# Patient Record
Sex: Female | Born: 1970 | Race: Black or African American | Hispanic: No | Smoking: Never smoker
Health system: Southern US, Community
[De-identification: ages and names within clinical notes are randomized; demographics above are authoritative.]

## PROBLEM LIST (undated history)

## (undated) DIAGNOSIS — I10 Essential (primary) hypertension: Secondary | ICD-10-CM

## (undated) DIAGNOSIS — C50919 Malignant neoplasm of unspecified site of unspecified female breast: Secondary | ICD-10-CM

## (undated) DIAGNOSIS — M329 Systemic lupus erythematosus, unspecified: Secondary | ICD-10-CM

## (undated) DIAGNOSIS — E785 Hyperlipidemia, unspecified: Secondary | ICD-10-CM

## (undated) HISTORY — DX: Hyperlipidemia, unspecified: E78.5

## (undated) HISTORY — DX: Essential (primary) hypertension: I10

## (undated) HISTORY — DX: Systemic lupus erythematosus, unspecified: M32.9

## (undated) HISTORY — DX: Malignant neoplasm of unspecified site of unspecified female breast: C50.919

---

## 1998-01-27 HISTORY — PX: OTHER SURGICAL HISTORY: SHX169

## 2004-06-27 HISTORY — PX: OTHER SURGICAL HISTORY: SHX169

## 2006-03-29 DIAGNOSIS — I10 Essential (primary) hypertension: Secondary | ICD-10-CM

## 2006-03-29 HISTORY — DX: Essential (primary) hypertension: I10

## 2010-02-16 ENCOUNTER — Ambulatory Visit: Payer: Self-pay | Admitting: Family Medicine

## 2010-02-16 DIAGNOSIS — I10 Essential (primary) hypertension: Secondary | ICD-10-CM

## 2010-02-16 DIAGNOSIS — M329 Systemic lupus erythematosus, unspecified: Secondary | ICD-10-CM | POA: Insufficient documentation

## 2010-02-16 DIAGNOSIS — I889 Nonspecific lymphadenitis, unspecified: Secondary | ICD-10-CM

## 2010-02-17 ENCOUNTER — Encounter: Payer: Self-pay | Admitting: Family Medicine

## 2010-03-09 ENCOUNTER — Encounter (INDEPENDENT_AMBULATORY_CARE_PROVIDER_SITE_OTHER): Payer: Self-pay | Admitting: *Deleted

## 2010-03-17 ENCOUNTER — Encounter: Payer: Self-pay | Admitting: Family Medicine

## 2010-03-17 LAB — CONVERTED CEMR LAB
ALT: 8 units/L (ref 0–35)
AST: 17 units/L (ref 0–37)
Alkaline Phosphatase: 102 units/L (ref 39–117)
Cholesterol: 262 mg/dL — ABNORMAL HIGH (ref 0–200)
Creatinine, Ser: 0.9 mg/dL (ref 0.40–1.20)
LDL Cholesterol: 197 mg/dL — ABNORMAL HIGH (ref 0–99)
Sodium: 139 meq/L (ref 135–145)
Total Bilirubin: 0.4 mg/dL (ref 0.3–1.2)
Total CHOL/HDL Ratio: 6.6
Total Protein: 7.9 g/dL (ref 6.0–8.3)
VLDL: 25 mg/dL (ref 0–40)

## 2010-03-19 ENCOUNTER — Encounter: Payer: Self-pay | Admitting: Family Medicine

## 2010-03-19 LAB — CONVERTED CEMR LAB
ANA Titer 1: 1:640 {titer} — ABNORMAL HIGH
Anti Nuclear Antibody(ANA): POSITIVE — AB
Basophils Relative: 1 % (ref 0–1)
Hemoglobin: 11.7 g/dL — ABNORMAL LOW (ref 12.0–15.0)
Lymphocytes Relative: 38 % (ref 12–46)
MCHC: 32.9 g/dL (ref 30.0–36.0)
Monocytes Relative: 9 % (ref 3–12)
Neutro Abs: 3 10*3/uL (ref 1.7–7.7)
Neutrophils Relative %: 51 % (ref 43–77)
RBC: 4.95 M/uL (ref 3.87–5.11)
TSH: 0.961 microintl units/mL (ref 0.350–4.500)
WBC: 5.9 10*3/uL (ref 4.0–10.5)

## 2010-03-20 LAB — CONVERTED CEMR LAB: Iron: 135 ug/dL (ref 42–145)

## 2010-03-25 ENCOUNTER — Ambulatory Visit: Payer: Self-pay | Admitting: Family Medicine

## 2010-04-13 ENCOUNTER — Ambulatory Visit
Admission: RE | Admit: 2010-04-13 | Discharge: 2010-04-13 | Payer: Self-pay | Source: Home / Self Care | Attending: Family Medicine | Admitting: Family Medicine

## 2010-04-28 NOTE — Assessment & Plan Note (Signed)
Summary: NOV: HTN, LN   Vital Signs:  Patient profile:   40 year old female Height:      62.5 inches Weight:      140 pounds Pulse rate:   98 / minute BP sitting:   199 / 120  (right arm) Cuff size:   regular  Vitals Entered By: Avon Gully CMA, Duncan Dull) (February 16, 2010 1:26 PM)  Serial Vital Signs/Assessments:  Time      Position  BP       Pulse  Resp  Temp     By 1:50 PM             188/105                        Avon Gully CMA, (AAMA)  CC: NP- est care   CC:  NP- est care.  History of Present Illness: Hasn't estab a rheumatologist, Would like to consider seeing Dr. Conception Oms.   HTN - Dx several years ago. Moved her a year ago and hasn't take meds in a long time.   No regular exercise. Not really following a low salt diet. BPS have been running in teh 160s/90s at home.  Also has lumps on the right posterior side of her neck that have been there for several months.   Nontender, no rash or redness.   Habits & Providers  Alcohol-Tobacco-Diet     Alcohol drinks/day: <1     Tobacco Status: never  Exercise-Depression-Behavior     Does Patient Exercise: no     STD Risk: never     Drug Use: never     Seat Belt Use: always  Current Medications (verified): 1)  Omega-3 350 Mg Caps (Omega-3 Fatty Acids)  Allergies (verified): 1)  ! * Sunblock  Comments:  Nurse/Medical Assistant: The patient's medications and allergies were reviewed with the patient and were updated in the Medication and Allergy Lists. 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 Lives alone. Smoking Status:  never Does Patient Exercise:  no STD Risk:  never Drug Use:   never Seat Belt Use:  always  Review of Systems       No fever/sweats/weakness, unexplained weight loss/gain.  No vison changes.  No difficulty hearing/ringing in ears, hay fever/allergies.  No chest pain/discomfort, palpitations.  No Br lump/nipple discharge.  No cough/wheeze.  No blood in BM, nausea/vomiting/diarrhea.  No nighttime urination, leaking urine, unusual vaginal bleeding, discharge (penis or vagina).  No muscle/joint pain. No rash, change in mole.  No HA, memory loss.  No anxiety, sleep d/o, depression.  No easy bruising/bleeding, + unexplained lump   Physical Exam  General:  Well-developed,well-nourished,in no acute distress; alert,appropriate and cooperative throughout examination Head:  Normocephalic and atraumatic without obvious abnormalities. No apparent alopecia or balding. Lungs:  Normal respiratory effort, chest expands symmetrically. Lungs are clear to auscultation, no crackles or wheezes. Heart:  Normal rate and regular rhythm. S1 and S2 normal without gallop, murmur, click, rub or other extra sounds. Skin:  Think hair Scarring on her right lower abdomen.  Cervical Nodes:  She die have 2 palpable LN on the posterior right side of her neck.  They feel pea size and are firm but not hard.    Impression & Recommendations:  Problem # 1:  HYPERTENSION, BENIGN ESSENTIAL (ICD-401.1) Start medication. Discussed potential SE of the meds. F/U in one month.  Discussed importance of tx'ing BP to reduce risk of stroke and MI.  Due for screenign labs as well and need a baseline kidney function.  Her updated medication list for this problem includes:    Losartan Potassium-hctz 100-25 Mg Tabs (Losartan potassium-hctz) .Marland Kitchen... Take 1 tablet by mouth once a day  Orders: T-Comprehensive Metabolic Panel [id number]) T-Lipid Profile ([id number])  Problem # 2:  LYMPHADENITIS (ICD-289.3) Will monitor the LN in her neck Call if getting larger, painful or hot.   Complete Medication  List: 1)  Omega-3 350 Mg Caps (Omega-3 fatty acids) 2)  Losartan Potassium-hctz 100-25 Mg Tabs (Losartan potassium-hctz) .... Take 1 tablet by mouth once a day  Patient Instructions: 1)  Please schedule a follow-up appointment in 1 month for blood pressure. 2)  Work on regular exercise adn low salt diet. Check out the DASH diet (.[email or web address].net/ website) to download it for free.   Contraindications/Deferment of Procedures/Staging:    Test/Procedure: FLU VAX    Reason for deferment: patient declined  Prescriptions: LOSARTAN POTASSIUM-HCTZ 100-25 MG TABS (LOSARTAN POTASSIUM-HCTZ) Take 1 tablet by mouth once a day  #30 x 1   Entered and Authorized by:   Nani Gasser MD   Signed by:   Nani Gasser MD on 02/16/2010   Method used:   Electronically to        [facility] [phone]* (retail)       [address]       [place], [place]  [address]       Ph: [phone] or [phone]       Fax: [phone]   RxID:   [id number]    Orders Added: 1)  T-Comprehensive Metabolic Panel [[id number]] 2)  T-Lipid Profile [[id number]] 3)  New Patient Level III [[address]]

## 2010-04-30 NOTE — Assessment & Plan Note (Signed)
Summary: 1 mo f/u on BP    Vital Signs:  Patient profile:   40 year old female Height:      62.5 inches Weight:      140 pounds Pulse rate:   84 / minute BP sitting:   172 / 87  (right arm)  Vitals Entered By: Avon Gully CMA, Duncan Dull) (March 25, 2010 4:36 PM)  History of Present Illness: Took med this morning.  Pressures this AM was 126/75. last night BP was 120/69 with home cuff.  Mostly running in this range. Says has felt a little light headed when bends over and then stands up.   Allergies: 1)  ! * Sunblock  Physical Exam  General:  Well-developed,well-nourished,in no acute distress; alert,appropriate and cooperative throughout examination Head:  Normocephalic and atraumatic without obvious abnormalities. No apparent alopecia or balding. Eyes:  No corneal or conjunctival inflammation noted. EOMI. Perrla. Lungs:  Normal respiratory effort, chest expands symmetrically. Lungs are clear to auscultation, no crackles or wheezes. Heart:  Normal rate and regular rhythm. S1 and S2 normal without gallop, murmur, click, rub or other extra sounds. Skin:  no rashes.   Psych:  Cognition and judgment appear intact. Alert and cooperative with normal attention span and concentration. No apparent delusions, illusions, hallucinations   Impression & Recommendations:  Problem # 1:  HYPERTENSION, BENIGN ESSENTIAL (ICD-401.1) Discussed that her BP is better today but still not at goal.  her home BPs have been great but I am concerned her BP may not be accurate so I have asked her to  bring in her BP cuff from home in one week and recheck wth the nurse. If still high then will adjust meds but if cuff is accurate and home BPs are well controlled then will continue current regimen.  Her updated medication list for this problem includes:    Losartan Potassium-hctz 100-25 Mg Tabs (Losartan potassium-hctz) .Marland Kitchen... Take 1 tablet by mouth once a day  Complete Medication List: 1)  Omega-3 350 Mg Caps  (Omega-3 fatty acids) 2)  Losartan Potassium-hctz 100-25 Mg Tabs (Losartan potassium-hctz) .... Take 1 tablet by mouth once a day 3)  Lipitor 40 Mg Tabs (Atorvastatin calcium) .... Take 1 tablet by mouth once a day at bedtime  Patient Instructions: 1)  follow up in one week for Blood pressure check.    Orders Added: 1)  Est. Patient Level III [72536]

## 2010-04-30 NOTE — Assessment & Plan Note (Signed)
Summary: bp ck  Nurse Visit   Vital Signs:  Patient profile:   40 year old female Pulse rate:   80 / minute BP sitting:   160 / 96  (right arm) Cuff size:   regular   Impression & Recommendations:  Problem # 1:  HYPERTENSION, BENIGN ESSENTIAL (ICD-401.1) Home BP cuff is accurate, which means home BPs look great. Continue current regimen. follow up for recheck in 4 months.  Her updated medication list for this problem includes:    Losartan Potassium-hctz 100-25 Mg Tabs (Losartan potassium-hctz) .Marland Kitchen... Take 1 tablet by mouth once a day  Complete Medication List: 1)  Omega-3 350 Mg Caps (Omega-3 fatty acids) 2)  Losartan Potassium-hctz 100-25 Mg Tabs (Losartan potassium-hctz) .... Take 1 tablet by mouth once a day 3)  Lipitor 40 Mg Tabs (Atorvastatin calcium) .... Take 1 tablet by mouth once a day at bedtime   Serial Vital Signs/Assessments:  Comments: 3:10 PM 152/92 taken with pt's bp machine By: Avon Gully CMA, (AAMA)    Allergies: 1)  ! * Sunblock  Orders Added: 1)  Est. Patient Level I [[address]] Prescriptions: LOSARTAN POTASSIUM-HCTZ 100-25 MG TABS (LOSARTAN POTASSIUM-HCTZ) Take 1 tablet by mouth once a day  #30 x 1   Entered by:   Avon Gully CMA, (AAMA)   Authorized by:   Nani Gasser MD   Signed by:   Avon Gully CMA, (AAMA) on 04/14/2010   Method used:   Electronically to        [facility] [phone]* (retail)       [address] [place], [place]  [address]       Ph: [phone] or [phone]       Fax: [phone]   RxID:   [id number]  10:53 AM April 14, 2010 McCrimmon CMA, Duncan Dull), Sue Lush pt notified of above.needs refill of losartan

## 2010-04-30 NOTE — Letter (Signed)
Summary: Generic Letter  [facility]  [address], [address]   [place], [place] [address]   Phone: [phone]  Fax: [phone]    03/09/2010  POLLIE POMA [address] [place], [place]  [address]  Dear Ms. Winterbottom,  We have been unable to reach you by phone. We have enclosed your lab results and reccomendations in this envelope.Please call us back with any questions you may have and check your pharmacy later for any perscriptions that we may send.         Sincerely,   Nani Gasser, MD

## 2010-04-30 NOTE — Letter (Signed)
Summary: Generic Letter  [facility]  [address], [address]   [place], [place] [address]   Phone: [phone]  Fax: [phone]    03/09/2010  Theresa Fisher [address] [place], [place]  [address]  Dear Ms. Shave,           Sincerely,   Avon Gully CMA, (AAMA)

## 2010-06-04 ENCOUNTER — Telehealth: Payer: Self-pay | Admitting: Family Medicine

## 2010-06-04 DIAGNOSIS — E785 Hyperlipidemia, unspecified: Secondary | ICD-10-CM | POA: Insufficient documentation

## 2010-06-09 NOTE — Progress Notes (Signed)
  Phone Note Call from Patient   Summary of Call: pt called and wants an order sent to the lab to check her chol Initial call taken by: Avon Gully CMA, Duncan Dull),  June 04, 2010 2:27 PM  New Problems: HYPERLIPIDEMIA (ICD-272.4)   New Problems: HYPERLIPIDEMIA (ICD-272.4)

## 2010-06-24 ENCOUNTER — Other Ambulatory Visit: Payer: Self-pay | Admitting: Family Medicine

## 2010-07-23 ENCOUNTER — Ambulatory Visit: Payer: Self-pay | Admitting: Family Medicine

## 2010-07-23 ENCOUNTER — Encounter: Payer: Self-pay | Admitting: Family Medicine

## 2010-07-31 ENCOUNTER — Encounter: Payer: Self-pay | Admitting: Family Medicine

## 2010-07-31 ENCOUNTER — Ambulatory Visit (INDEPENDENT_AMBULATORY_CARE_PROVIDER_SITE_OTHER): Payer: PRIVATE HEALTH INSURANCE | Admitting: Family Medicine

## 2010-07-31 DIAGNOSIS — I10 Essential (primary) hypertension: Secondary | ICD-10-CM

## 2010-07-31 DIAGNOSIS — E785 Hyperlipidemia, unspecified: Secondary | ICD-10-CM

## 2010-07-31 NOTE — Patient Instructions (Signed)
Follow up in 6 months 

## 2010-07-31 NOTE — Progress Notes (Signed)
  Subjective:    Patient ID: Theresa Fisher, female    DOB: Sep 16, 1970, 40 y.o.   MRN: [id number]  HPI Doing well on the lipitor and BP.  No SE. No CP or SOB.  No muscle aches.  Hasn't started any regular exercise.Has been watching the salt in her diet.    BPs have been running in the 130s.   Review of Systems     Objective:   Physical Exam  Constitutional: She appears well-developed and well-nourished.  Cardiovascular: Normal rate, regular rhythm and normal heart sounds.   Pulmonary/Chest: Effort normal and breath sounds normal.  Musculoskeletal: She exhibits no edema.          Assessment & Plan:

## 2010-07-31 NOTE — Assessment & Plan Note (Signed)
BP is under control. Continue current regimen. Recheck in 6months.

## 2010-07-31 NOTE — Assessment & Plan Note (Signed)
At goal. No SE of the statin.  Labs are at goal.

## 2010-08-04 ENCOUNTER — Other Ambulatory Visit: Payer: Self-pay | Admitting: Family Medicine

## 2010-08-04 DIAGNOSIS — M329 Systemic lupus erythematosus, unspecified: Secondary | ICD-10-CM

## 2010-08-19 ENCOUNTER — Encounter: Payer: Self-pay | Admitting: Family Medicine

## 2010-09-12 ENCOUNTER — Other Ambulatory Visit: Payer: Self-pay | Admitting: Family Medicine

## 2010-11-13 ENCOUNTER — Other Ambulatory Visit: Payer: Self-pay | Admitting: Family Medicine

## 2011-01-26 ENCOUNTER — Other Ambulatory Visit: Payer: Self-pay | Admitting: Family Medicine

## 2011-03-03 ENCOUNTER — Other Ambulatory Visit: Payer: Self-pay | Admitting: Family Medicine

## 2011-04-09 ENCOUNTER — Other Ambulatory Visit: Payer: Self-pay | Admitting: Family Medicine

## 2011-06-12 ENCOUNTER — Other Ambulatory Visit: Payer: Self-pay | Admitting: Family Medicine

## 2011-06-14 NOTE — Telephone Encounter (Signed)
Needs appointment

## 2011-08-01 ENCOUNTER — Other Ambulatory Visit: Payer: Self-pay | Admitting: Family Medicine

## 2011-09-15 ENCOUNTER — Other Ambulatory Visit: Payer: Self-pay | Admitting: Family Medicine

## 2011-09-15 NOTE — Telephone Encounter (Signed)
Must make appt

## 2011-09-30 ENCOUNTER — Other Ambulatory Visit: Payer: Self-pay | Admitting: Family Medicine

## 2011-10-08 ENCOUNTER — Other Ambulatory Visit: Payer: Self-pay | Admitting: Family Medicine

## 2011-10-11 NOTE — Telephone Encounter (Signed)
Must make appointment 

## 2011-10-13 ENCOUNTER — Ambulatory Visit (INDEPENDENT_AMBULATORY_CARE_PROVIDER_SITE_OTHER): Payer: PRIVATE HEALTH INSURANCE | Admitting: Physician Assistant

## 2011-10-13 ENCOUNTER — Encounter: Payer: Self-pay | Admitting: Physician Assistant

## 2011-10-13 VITALS — BP 160/94 | HR 89 | Ht 62.75 in | Wt 136.0 lb

## 2011-10-13 DIAGNOSIS — Z131 Encounter for screening for diabetes mellitus: Secondary | ICD-10-CM

## 2011-10-13 DIAGNOSIS — Z0000 Encounter for general adult medical examination without abnormal findings: Secondary | ICD-10-CM

## 2011-10-13 DIAGNOSIS — Z23 Encounter for immunization: Secondary | ICD-10-CM

## 2011-10-13 DIAGNOSIS — I10 Essential (primary) hypertension: Secondary | ICD-10-CM

## 2011-10-13 DIAGNOSIS — Z79899 Other long term (current) drug therapy: Secondary | ICD-10-CM

## 2011-10-13 DIAGNOSIS — Z1239 Encounter for other screening for malignant neoplasm of breast: Secondary | ICD-10-CM

## 2011-10-13 DIAGNOSIS — E785 Hyperlipidemia, unspecified: Secondary | ICD-10-CM

## 2011-10-13 MED ORDER — AMLODIPINE BESYLATE 2.5 MG PO TABS: 2.5000 mg | ORAL_TABLET | Freq: Every day | ORAL | Status: DC

## 2011-10-13 NOTE — Progress Notes (Signed)
  Subjective:    Patient ID: Theresa Fisher, female    DOB: 1970-10-28, 41 y.o.   MRN: [id number]  HPI    Review of Systems     Objective:   Physical Exam        Assessment & Plan:   Subjective:     Theresa Fisher is a 41 y.o. female and is here for a comprehensive physical exam. The patient reports no problems. PMH positive for SLE. SLE well controlled. Blood pressure has been elevated lately. Pt regularly takes Hyzaar 100/25/ Pt denies any chest pain, palpiatations, SOB, or HA;s. Pt has no concerns today.  History   Social History  . Marital Status: Divorced    Spouse Name: N/A    Number of Children: N/A  . Years of Education: N/A   Occupational History  . Not on file.   Social History Main Topics  . Smoking status: Never Smoker   . Smokeless tobacco: Not on file  . Alcohol Use: No  . Drug Use: No  . Sexually Active: Not on file   Other Topics Concern  . Not on file   Social History Narrative  . No narrative on file   Health Maintenance  Topic Date Due  . Mammogram  12/25/1986  . Tetanus/tdap  12/25/1987  . Pap Smear  10/12/2009  . Influenza Vaccine  12/28/2011    The following portions of the patient's history were reviewed and updated as appropriate: allergies, current medications, past family history, past medical history, past social history, past surgical history and problem list.  Review of Systems A comprehensive review of systems was negative.   Objective:    BP 160/94  Pulse 89  Ht 5' 2.75" (1.594 m)  Wt 136 lb (61.689 kg)  BMI 24.28 kg/m2  SpO2 99%  LMP 10/13/2011 General appearance: alert, cooperative and appears stated age Head: Normocephalic, without obvious abnormality, atraumatic Eyes: conjunctivae/corneas clear. PERRL, EOM's intact. Fundi benign. Ears: normal TM's and external ear canals both ears Nose: Nares normal. Septum midline. Mucosa normal. No drainage or sinus tenderness. Throat: lips, mucosa, and tongue normal;  teeth and gums normal Neck: no adenopathy, no carotid bruit, no JVD, supple, symmetrical, trachea midline and thyroid not enlarged, symmetric, no tenderness/mass/nodules Back: symmetric, no curvature. ROM normal. No CVA tenderness. Lungs: clear to auscultation bilaterally Breasts: Pt declined exam. Heart: regular rate and rhythm, S1, S2 normal, no murmur, click, rub or gallop Abdomen: soft, non-tender; bowel sounds normal; no masses,  no organomegaly Extremities: extremities normal, atraumatic, no cyanosis or edema Pulses: 2+ and symmetric Skin: Skin color, texture, turgor normal. No rashes or lesions Lymph nodes: Cervical, supraclavicular, and axillary nodes normal. Neurologic: Grossly normal    Assessment:    Healthy female exam.      Plan:    CPE/Hypertension-Will refer for mammogram. Recommendation is 4 servings of diary a day or 500mg  of calcium twice a day. Continue to exercise regularly. Balanced diet.   Tdap given. Reminded that patient needs pap smear.   Start Norvasc 2.5mg  once a day. Follow up 2 month for blood pressure recheck. Reminded that low salt diet and weight loss can also help decrease blood pressure.  See After Visit Summary for Counseling Recommendations

## 2011-10-13 NOTE — Patient Instructions (Addendum)
Will refer for mammogram. Recommendation is 4 servings of diary a day or 500mg  of calcium twice a day. Continue to exercise regularly. Balanced diet.   Tdap given. Reminded that patient needs pap smear.   Start Norvasc 2.5mg  once a day. Follow up 2 month for blood pressure recheck.

## 2011-10-20 ENCOUNTER — Other Ambulatory Visit: Payer: Self-pay | Admitting: Family Medicine

## 2011-12-30 ENCOUNTER — Other Ambulatory Visit: Payer: Self-pay | Admitting: Physician Assistant

## 2012-03-06 ENCOUNTER — Other Ambulatory Visit: Payer: Self-pay | Admitting: Physician Assistant

## 2012-03-27 ENCOUNTER — Other Ambulatory Visit: Payer: Self-pay | Admitting: *Deleted

## 2012-03-27 DIAGNOSIS — E785 Hyperlipidemia, unspecified: Secondary | ICD-10-CM

## 2012-03-27 MED ORDER — ATORVASTATIN CALCIUM 40 MG PO TABS: 40.0000 mg | ORAL_TABLET | Freq: Every day | ORAL | Status: DC

## 2012-04-11 ENCOUNTER — Other Ambulatory Visit: Payer: Self-pay | Admitting: Physician Assistant

## 2012-04-13 LAB — COMPREHENSIVE METABOLIC PANEL
ALT: 10 U/L (ref 0–35)
AST: 16 U/L (ref 0–37)
CO2: 27 mEq/L (ref 19–32)
Calcium: 9.5 mg/dL (ref 8.4–10.5)
Chloride: 103 mEq/L (ref 96–112)
Sodium: 140 mEq/L (ref 135–145)
Total Bilirubin: 0.5 mg/dL (ref 0.3–1.2)
Total Protein: 7.2 g/dL (ref 6.0–8.3)

## 2012-04-13 LAB — LIPID PANEL
Cholesterol: 163 mg/dL (ref 0–200)
Total CHOL/HDL Ratio: 3.9 Ratio
VLDL: 16 mg/dL (ref 0–40)

## 2012-05-15 ENCOUNTER — Other Ambulatory Visit: Payer: Self-pay | Admitting: Family Medicine

## 2012-05-25 ENCOUNTER — Ambulatory Visit (INDEPENDENT_AMBULATORY_CARE_PROVIDER_SITE_OTHER): Payer: PRIVATE HEALTH INSURANCE | Admitting: Family Medicine

## 2012-05-25 ENCOUNTER — Encounter: Payer: Self-pay | Admitting: Family Medicine

## 2012-05-25 VITALS — BP 177/88 | HR 86 | Ht 63.0 in | Wt 139.0 lb

## 2012-05-25 DIAGNOSIS — M329 Systemic lupus erythematosus, unspecified: Secondary | ICD-10-CM

## 2012-05-25 DIAGNOSIS — E785 Hyperlipidemia, unspecified: Secondary | ICD-10-CM

## 2012-05-25 DIAGNOSIS — I10 Essential (primary) hypertension: Secondary | ICD-10-CM

## 2012-05-25 MED ORDER — ATORVASTATIN CALCIUM 40 MG PO TABS: 40.0000 mg | ORAL_TABLET | Freq: Every day | ORAL | Status: DC

## 2012-05-25 MED ORDER — AMLODIPINE BESYLATE 5 MG PO TABS: 5.0000 mg | ORAL_TABLET | Freq: Every day | ORAL | Status: DC

## 2012-05-25 NOTE — Progress Notes (Signed)
Subjective:    Patient ID: Theresa Fisher, female    DOB: 28-May-1970, 42 y.o.   MRN: [id number]  HPI HTN-  Pt denies chest pain, SOB, dizziness, or heart palpitations.  Taking meds as directed w/o problems.  Denies medication side effects. Occ check BP at home but not recently.  Says usually runs around 130.  The she was very upset today. She felt like our office was not doing a good job with communicating with her. She felt like she should have known him that she needed an appointment in  refill her medications and then being told that she needed an appointment. Note we did have lisinopril HCT on her medication list but she says that she has every been heard or seen at and is into why it is on there  Hyperlipidemia - Was taking the lipitor about 2 weeks before went for the labwork.  She has been tolerating well.  No S.E. she feels like she does a good job with her diet.  She has lupus and is followed by Dr. Lanell Matar at [facility]. She is currently on Plaquenil. She did request a recommendation for an ophthalmologist in the area for her regular eye exams.  Review of Systems     Objective:   Physical Exam  Constitutional: She is oriented to person, place, and time. She appears well-developed and well-nourished.  HENT:  Head: Normocephalic and atraumatic.  Cardiovascular: Normal rate, regular rhythm and normal heart sounds.   No carotids.    Pulmonary/Chest: Effort normal and breath sounds normal.  Musculoskeletal: She exhibits no edema.  Neurological: She is alert and oriented to person, place, and time.  Skin: Skin is warm and dry.  Psychiatric: She has a normal mood and affect. Her behavior is normal.          Assessment & Plan:  HTN - uncontrolled. Is unclear to me this is more long-term poor control or suspicious because she's very upset and agitated today. Discussed option of coming back a week or 2 to recheck her blood pressure versus adjusting her medication today. She opted  to quit and adjust her medication today. We'll increase amlodipine to 5 mg. One about potential side effects including ankle edema. She's to call if she has any concerns. Otherwise followup in 6-8 weeks to make sure that she's tolerating it well and to make sure her blood pressure is reaching goal.  Hyperlipidemia- her labs looked significantly better on the Lipitor. I did send a refill sent to pharmacy so that she can get back on it. She says she quit taking it about a month ago. She was confused and thought she was supposed to stop it. I encouraged her to get back on it and I apologize for the this information. Also continue with regular exercise and healthy diet. Lab Results  Component Value Date   CHOL 163 04/11/2012   HDL 42 04/11/2012   LDLCALC 105* 04/11/2012   TRIG 80 04/11/2012   CHOLHDL 3.9 04/11/2012   Lupus-recommend [facility] as surgeons, Dr. Lorenso Courier is fantastic and she could certainly get her regular eye care there especially since she needs to be monitored more carefully since she is on Plaquenil.  Time spent 25 minutes, greater than 50% discussing her blood pressure cholesterol and referral for eye care.  The future I did go over the active visit summary sheet with her to show her where to look to see when a followup appointment is needed. And also to check for accuracy  on her medications. I let her note that we have printed 1 at her physical in July that had specifically requested that she return in 2 months for a blood pressure check. Hopefully,  this will help improve communication issues.

## 2012-05-25 NOTE — Patient Instructions (Addendum)
[facility] Dr. Gennette Pac Gram Low Sodium Diet A 1.5 gram sodium diet restricts the amount of sodium in the diet to no more than 1.5 g or 1500 mg daily. The American Heart Association recommends Americans over the age of 39 to consume no more than 1500 mg of sodium each day to reduce the risk of developing high blood pressure. Research also shows that limiting sodium may reduce heart attack and stroke risk. Many foods contain sodium for flavor and sometimes as a preservative. When the amount of sodium in a diet needs to be low, it is important to know what to look for when choosing foods and drinks. The following includes some information and guidelines to help make it easier for you to adapt to a low sodium diet. QUICK TIPS  Do not add salt to food.  Avoid convenience items and fast food.  Choose unsalted snack foods.  Buy lower sodium products, often labeled as "lower sodium" or "no salt added."  Check food labels to learn how much sodium is in 1 serving.  When eating at a restaurant, ask that your food be prepared with less salt or none, if possible. READING FOOD LABELS FOR SODIUM INFORMATION The nutrition facts label is a good place to find how much sodium is in foods. Look for products with no more than 400 mg of sodium per serving. Remember that 1.5 g = 1500 mg. The food label may also list foods as:  Sodium-free: Less than 5 mg in a serving.  Very low sodium: 35 mg or less in a serving.  Low-sodium: 140 mg or less in a serving.  Light in sodium: 50% less sodium in a serving. For example, if a food that usually has 300 mg of sodium is changed to become light in sodium, it will have 150 mg of sodium.  Reduced sodium: 25% less sodium in a serving. For example, if a food that usually has 400 mg of sodium is changed to reduced sodium, it will have 300 mg of sodium. CHOOSING FOODS Grains  Avoid: Salted crackers and snack items. Some cereals, including instant hot  cereals. Bread stuffing and biscuit mixes. Seasoned rice or pasta mixes.  Choose: Unsalted snack items. Low-sodium cereals, oats, puffed wheat and rice, shredded wheat. English muffins and bread. Pasta. Meats  Avoid: Salted, canned, smoked, spiced, pickled meats, including fish and poultry. Bacon, ham, sausage, cold cuts, hot dogs, anchovies.  Choose: Low-sodium canned tuna and salmon. Fresh or frozen meat, poultry, and fish. Dairy  Avoid: Processed cheese and spreads. Cottage cheese. Buttermilk and condensed milk. Regular cheese.  Choose: Milk. Low-sodium cottage cheese. Yogurt. Sour cream. Low-sodium cheese. Fruits and Vegetables  Avoid: Regular canned vegetables. Regular canned tomato sauce and paste. Frozen vegetables in sauces. Olives. Rosita Fire. Relishes. Sauerkraut.  Choose: Low-sodium canned vegetables. Low-sodium tomato sauce and paste. Frozen or fresh vegetables. Fresh and frozen fruit. Condiments  Avoid: Canned and packaged gravies. Worcestershire sauce. Tartar sauce. Barbecue sauce. Soy sauce. Steak sauce. Ketchup. Onion, garlic, and table salt. Meat flavorings and tenderizers.  Choose: Fresh and dried herbs and spices. Low-sodium varieties of mustard and ketchup. Lemon juice. Tabasco sauce. Horseradish. SAMPLE 1.5 GRAM SODIUM MEAL PLAN Breakfast / Sodium (mg)  1 cup low-fat milk / 143 mg  1 whole-wheat English muffin / 240 mg  1 tbs heart-healthy margarine / 153 mg  1 hard-boiled egg / 139 mg  1 small orange / 0 mg Lunch / Sodium (mg)  1 cup raw carrots /  76 mg  2 tbs no salt added peanut butter / 5 mg  2 slices whole-wheat bread / 270 mg  1 tbs jelly / 6 mg   cup red grapes / 2 mg Dinner / Sodium (mg)  1 cup whole-wheat pasta / 2 mg  1 cup low-sodium tomato sauce / 73 mg  3 oz lean ground beef / 57 mg  1 small side salad (1 cup raw spinach leaves,  cup cucumber,  cup yellow bell pepper) with 1 tsp olive oil and 1 tsp red wine vinegar / 25 mg Snack  / Sodium (mg)  1 container low-fat vanilla yogurt / 107 mg  3 graham cracker squares / 127 mg Nutrient Analysis  Calories: 1745  Protein: 75 g  Carbohydrate: 237 g  Fat: 57 g  Sodium: 1425 mg Document Released: 03/15/2005 Document Revised: 06/07/2011 Document Reviewed: 06/16/2009 [facility] Patient Information 2013 [place], [place].

## 2012-07-13 ENCOUNTER — Ambulatory Visit: Payer: PRIVATE HEALTH INSURANCE | Admitting: Family Medicine

## 2012-07-25 ENCOUNTER — Ambulatory Visit (INDEPENDENT_AMBULATORY_CARE_PROVIDER_SITE_OTHER): Payer: PRIVATE HEALTH INSURANCE | Admitting: Family Medicine

## 2012-07-25 ENCOUNTER — Encounter: Payer: Self-pay | Admitting: Family Medicine

## 2012-07-25 VITALS — BP 158/88 | HR 96 | Wt 134.0 lb

## 2012-07-25 DIAGNOSIS — Z5181 Encounter for therapeutic drug level monitoring: Secondary | ICD-10-CM

## 2012-07-25 DIAGNOSIS — I10 Essential (primary) hypertension: Secondary | ICD-10-CM

## 2012-07-25 DIAGNOSIS — Z1231 Encounter for screening mammogram for malignant neoplasm of breast: Secondary | ICD-10-CM

## 2012-07-25 MED ORDER — HYDROCHLOROTHIAZIDE 12.5 MG PO CAPS: 12.5000 mg | ORAL_CAPSULE | Freq: Every day | ORAL | Status: DC

## 2012-07-25 MED ORDER — AMLODIPINE BESYLATE 5 MG PO TABS: 5.0000 mg | ORAL_TABLET | Freq: Every day | ORAL | Status: DC

## 2012-07-25 NOTE — Progress Notes (Signed)
  Subjective:    Patient ID: Theresa Fisher, female    DOB: [birth year], 42 y.o.   MRN: [id number]  HPI HTN -  Pt denies chest pain, SOB, dizziness, or heart palpitations.  Taking meds as directed w/o problems.  Denies medication side effects. HOme BPs running around 140/80.     Review of Systems     Objective:   Physical Exam  Constitutional: She is oriented to person, place, and time. She appears well-developed and well-nourished.  HENT:  Head: Normocephalic and atraumatic.  Cardiovascular: Normal rate, regular rhythm and normal heart sounds.   Pulmonary/Chest: Effort normal and breath sounds normal.  Neurological: She is alert and oriented to person, place, and time.  Skin: Skin is warm and dry.  Psychiatric: She has a normal mood and affect. Her behavior is normal.          Assessment & Plan:  HTN-Will add HCTZ 12.5 to her amlodipine. F/U in 2 months. Continue to monitor BP at home.   SLE- She is on plaquenil. Reminded her again to get an eye exam. She hasn't done this yet  Will refer for mammo again. Stronlgly encouraged her to go.

## 2012-09-25 ENCOUNTER — Ambulatory Visit: Payer: PRIVATE HEALTH INSURANCE | Admitting: Family Medicine

## 2012-10-06 ENCOUNTER — Ambulatory Visit: Payer: PRIVATE HEALTH INSURANCE | Admitting: Family Medicine

## 2012-10-27 ENCOUNTER — Other Ambulatory Visit: Payer: Self-pay | Admitting: Family Medicine

## 2012-11-11 ENCOUNTER — Other Ambulatory Visit: Payer: Self-pay | Admitting: Family Medicine

## 2012-11-30 ENCOUNTER — Other Ambulatory Visit: Payer: Self-pay | Admitting: *Deleted

## 2012-11-30 MED ORDER — HYDROCHLOROTHIAZIDE 12.5 MG PO CAPS: ORAL_CAPSULE | ORAL | Status: DC

## 2012-12-01 ENCOUNTER — Ambulatory Visit: Payer: PRIVATE HEALTH INSURANCE | Admitting: Family Medicine

## 2012-12-07 ENCOUNTER — Ambulatory Visit (INDEPENDENT_AMBULATORY_CARE_PROVIDER_SITE_OTHER): Payer: PRIVATE HEALTH INSURANCE | Admitting: Family Medicine

## 2012-12-07 ENCOUNTER — Encounter: Payer: Self-pay | Admitting: Family Medicine

## 2012-12-07 VITALS — BP 137/76 | HR 90 | Wt 135.0 lb

## 2012-12-07 DIAGNOSIS — L659 Nonscarring hair loss, unspecified: Secondary | ICD-10-CM

## 2012-12-07 DIAGNOSIS — I10 Essential (primary) hypertension: Secondary | ICD-10-CM

## 2012-12-07 DIAGNOSIS — E785 Hyperlipidemia, unspecified: Secondary | ICD-10-CM

## 2012-12-07 MED ORDER — HYDROCHLOROTHIAZIDE 12.5 MG PO CAPS: ORAL_CAPSULE | ORAL | Status: DC

## 2012-12-07 MED ORDER — ATORVASTATIN CALCIUM 40 MG PO TABS: 40.0000 mg | ORAL_TABLET | Freq: Every day | ORAL | Status: AC

## 2012-12-07 MED ORDER — AMLODIPINE BESYLATE 5 MG PO TABS: 5.0000 mg | ORAL_TABLET | Freq: Every day | ORAL | Status: DC

## 2012-12-07 NOTE — Progress Notes (Signed)
  Subjective:    Patient ID: Theresa Fisher, female    DOB: [birth year], 42 y.o.   MRN: [id number]  HPI HTN-  Pt denies chest pain, SOB, dizziness, or heart palpitations.  Taking meds as directed w/o problems.  Denies medication side effects.  We added hydrochlorothiazide at the last office visit. She's tolerating it well. She denies any ankle swelling. She denies any excess dryness or frequent urination on the medication.  Hyperlipidemia doing well.   Lab Results  Component Value Date   CHOL 163 04/11/2012   HDL 42 04/11/2012   LDLCALC 105* 04/11/2012   TRIG 80 04/11/2012   CHOLHDL 3.9 04/11/2012       Chemistry      Component Value Date/Time   NA 140 04/11/2012 0000   K 3.7 04/11/2012 0000   CL 103 04/11/2012 0000   CO2 27 04/11/2012 0000   BUN 16 04/11/2012 0000   CREATININE 0.81 04/11/2012 0000   CREATININE 0.90 02/17/2010 2057      Component Value Date/Time   CALCIUM 9.5 04/11/2012 0000   ALKPHOS 111 04/11/2012 0000   AST 16 04/11/2012 0000   ALT 10 04/11/2012 0000   BILITOT 0.5 04/11/2012 0000       -Review of Systems     Objective:   Physical Exam  Constitutional: She is oriented to person, place, and time. She appears well-developed and well-nourished.  HENT:  Head: Normocephalic and atraumatic.  Cardiovascular: Normal rate, regular rhythm and normal heart sounds.   Pulmonary/Chest: Effort normal and breath sounds normal.  Neurological: She is alert and oriented to person, place, and time.  Skin: Skin is warm and dry.  Diffuse think hair.   Psychiatric: She has a normal mood and affect. Her behavior is normal.          Assessment & Plan:  HTN - Well controlled. Continue current regimen. F/U in 6 months.    Hyperlipidemia-Well controlled. F/U in 6 months.    Hair loss-secondary to lupus. Encouraged her to think about getting a consult with Dr. Vallery Sa over at [facility].

## 2012-12-07 NOTE — Patient Instructions (Signed)
Dr. Amy McMichael  

## 2012-12-12 NOTE — Progress Notes (Signed)
  Subjective:    Patient ID: Theresa Fisher, female    DOB: Apr 04, 1970, 42 y.o.   MRN: [id number]  HPI    Review of Systems     Objective:   Physical Exam        Assessment & Plan:  Not seen today. meds refilled.

## 2013-11-05 ENCOUNTER — Other Ambulatory Visit: Payer: Self-pay | Admitting: Family Medicine

## 2013-12-11 ENCOUNTER — Other Ambulatory Visit: Payer: Self-pay | Admitting: Family Medicine

## 2014-01-13 ENCOUNTER — Other Ambulatory Visit: Payer: Self-pay | Admitting: Family Medicine

## 2015-11-23 ENCOUNTER — Emergency Department (INDEPENDENT_AMBULATORY_CARE_PROVIDER_SITE_OTHER)
Admission: EM | Admit: 2015-11-23 | Discharge: 2015-11-23 | Disposition: A | Discharge status: Home / Self Care | Payer: PRIVATE HEALTH INSURANCE | Source: Home / Self Care | Attending: Emergency Medicine | Admitting: Emergency Medicine

## 2015-11-23 ENCOUNTER — Encounter: Payer: Self-pay | Admitting: Emergency Medicine

## 2015-11-23 DIAGNOSIS — H109 Unspecified conjunctivitis: Secondary | ICD-10-CM

## 2015-11-23 MED ORDER — POLYMYXIN B-TRIMETHOPRIM 10000-0.1 UNIT/ML-% OP SOLN: OPHTHALMIC | 0 refills | Status: DC

## 2015-11-23 NOTE — Discharge Instructions (Signed)
Use antibiotic eyedrops as instructed. Follow-up with your eye doctor if not improving in 3-4 days, sooner if worse or new symptoms

## 2015-11-23 NOTE — ED Triage Notes (Signed)
Patient presents to [facility] with C/o pain itching and irritation of the right eye with crusting in the mornings.

## 2015-11-23 NOTE — ED Provider Notes (Signed)
The[facility]    CSN: [id number] Arrival date & time: 11/23/15  1616  First Provider Contact:  First MD Initiated Contact with Patient 11/23/15 1701      History   Chief Complaint Chief Complaint  Patient presents with  . Conjunctivitis    HPI Devri Fisher is a 45 y.o. female.   HPI For the past week, nonspecific irritation right eye without any history of trauma or foreign body or seasonal allergies. Then, for the past 2 days, worsening redness and soreness and irritation right eye with a matted yellow crusty discharge upon awakening. Tried OTC "pink eye drops" which helped minimally. No fever or chills or nausea or vomiting or any other ENT symptoms. Denies any significant itch. Denies seasonal allergies. Denies sneezing or runny nose. Past Medical History:  Diagnosis Date  . Hypertension 2008    Patient Active Problem List   Diagnosis Date Noted  . HYPERLIPIDEMIA 06/04/2010  . LYMPHADENITIS 02/16/2010  . HYPERTENSION, BENIGN ESSENTIAL 02/16/2010  . SLE 02/16/2010  I questioned her about her SLE. She states its well controlled on medication. She denies having any eye abnormalities or eye symptoms from the SLE.  Past Surgical History:  Procedure Laterality Date  . cardiac window  01-1998  . mastopexy and augmentation  4-06    OB History    No data available       Home Medications    Prior to Admission medications   Medication Sig Start Date End Date Taking? Authorizing Provider  amLODipine (NORVASC) 5 MG tablet TAKE 1 TABLET (5 MG TOTAL) BY MOUTH DAILY. 12/11/13   Hali Marry, MD  atorvastatin (LIPITOR) 40 MG tablet Take 1 tablet (40 mg total) by mouth daily. 12/07/12   Hali Marry, MD  hydrochlorothiazide (MICROZIDE) 12.5 MG capsule Take 1 capsule (12.5 mg total) by mouth daily. Take 1 capsule (12.5 total) by mouth daily - OFFICE APPOINTMENT NECESSARY FOR REFILLS 01/14/14   Hali Marry, MD  hydroxychloroquine  (PLAQUENIL) 200 MG tablet Take 1 tablet (200 mg total) by mouth 2 (two) times daily. 08/04/10   Hali Marry, MD  trimethoprim-polymyxin b Mayra Neer) ophthalmic solution 2 drop in affected eye(s) every 6 hours (while awake) x 7 days 11/23/15   Jacqulyn Cane, MD    Family History Family History  Problem Relation Age of Onset  . Hyperlipidemia Mother   . Cancer Other   . Hypertension Other     Social History Social History  Substance Use Topics  . Smoking status: Never Smoker  . Smokeless tobacco: Never Used  . Alcohol use No     Allergies   Coppertone spf4; Penciclovir; and Penicillins   Review of Systems Review of Systems  All other systems reviewed and are negative.    Physical Exam Triage Vital Signs ED Triage Vitals  Enc Vitals Group     BP 11/23/15 1632 154/82     Pulse Rate 11/23/15 1632 85     Resp 11/23/15 1632 16     Temp 11/23/15 1632 98.2 F (36.8 C)     Temp Source 11/23/15 1632 Oral     SpO2 11/23/15 1632 100 %     Weight 11/23/15 1635 147 lb 4 oz (66.8 kg)     Height 11/23/15 1635 5\' 3"  (1.6 m)     Head Circumference --      Peak Flow --      Pain Score --      Pain Loc --  Pain Edu? --      Excl. in [place]? --    No data found.   Updated Vital Signs BP 154/82 (BP Location: Left Arm)   Pulse 85   Temp 98.2 F (36.8 C) (Oral)   Resp 16   Ht 5\' 3"  (1.6 m)   Wt 147 lb 4 oz (66.8 kg)   LMP 09/17/2015   SpO2 100%   BMI 26.08 kg/m   Visual Acuity Right Eye Distance: 20/15 Left Eye Distance: 20/20 Bilateral Distance: 20/13  Right Eye Near:   Left Eye Near:    Bilateral Near:     Physical Exam  Constitutional: She is oriented to person, place, and time. She appears well-developed and well-nourished. No distress.  HENT:  Head: Normocephalic and atraumatic. Head is without right periorbital erythema and without left periorbital erythema.  Right Ear: Tympanic membrane, external ear and ear canal normal.  Left Ear: Tympanic  membrane, external ear and ear canal normal.  Nose: Nose normal.  Mouth/Throat: Oropharynx is clear and moist. No oral lesions.  Eyes: EOM and lids are normal. Pupils are equal, round, and reactive to light. Right eye exhibits discharge (Mild, yellow, crusty discharge). Right eye exhibits no exudate and no hordeolum. No foreign body present in the right eye. Left eye exhibits no discharge, no exudate and no hordeolum. No foreign body present in the left eye. Right conjunctiva is injected. Right conjunctiva has no hemorrhage. Left conjunctiva is not injected. Left conjunctiva has no hemorrhage.  No evidence of corneal abrasion or foreign body.  Neck: Neck supple.  Lymphadenopathy:    She has no cervical adenopathy.  Neurological: She is alert and oriented to person, place, and time.  Skin: No rash noted.  Psychiatric: She has a normal mood and affect.     UC Treatments / Results  Labs (all labs ordered are listed, but only abnormal results are displayed) Labs Reviewed - No data to display  EKG  EKG Interpretation None       Radiology No results found.  Procedures Procedures (including critical care time)  Medications Ordered in UC Medications - No data to display   Initial Impression / Assessment and Plan / UC Course  I have reviewed the triage vital signs and the nursing notes.  Pertinent labs & imaging results that were available during my care of the patient were reviewed by me and considered in my medical decision making (see chart for details).  Clinical Course      Final Clinical Impressions(s) / UC Diagnoses   Final diagnoses:  Conjunctivitis of right eye   Treatment options discussed, as well as risks, benefits, alternatives. Patient voiced understanding and agreement with the following plans: Polytrim eyedrops. Other nonpharmacologic measures discussed  An After Visit Summary was printed and given to the patient. Follow-up with eye doctor if not  improving within 2-3 days, sooner if worse or new symptoms or any red flags. Precautions discussed. Red flags discussed. Questions invited and answered. She voiced understanding and agreement.   New Prescriptions New Prescriptions   TRIMETHOPRIM-POLYMYXIN B (POLYTRIM) OPHTHALMIC SOLUTION    2 drop in affected eye(s) every 6 hours (while awake) x 7 days     Jacqulyn Cane, MD 11/23/15 [phone]

## 2019-08-17 ENCOUNTER — Other Ambulatory Visit: Payer: Self-pay | Admitting: Internal Medicine

## 2019-08-17 DIAGNOSIS — Z1231 Encounter for screening mammogram for malignant neoplasm of breast: Secondary | ICD-10-CM

## 2019-09-10 ENCOUNTER — Other Ambulatory Visit: Payer: Self-pay | Admitting: Internal Medicine

## 2019-09-10 DIAGNOSIS — Z1211 Encounter for screening for malignant neoplasm of colon: Secondary | ICD-10-CM

## 2019-09-14 ENCOUNTER — Other Ambulatory Visit: Payer: Self-pay

## 2019-09-14 ENCOUNTER — Ambulatory Visit
Admission: RE | Admit: 2019-09-14 | Discharge: 2019-09-14 | Disposition: A | Discharge status: Home / Self Care | Payer: No Typology Code available for payment source | Source: Ambulatory Visit | Attending: Internal Medicine | Admitting: Internal Medicine

## 2019-09-14 DIAGNOSIS — Z1231 Encounter for screening mammogram for malignant neoplasm of breast: Secondary | ICD-10-CM

## 2019-09-19 ENCOUNTER — Other Ambulatory Visit: Payer: Self-pay | Admitting: Internal Medicine

## 2019-09-19 DIAGNOSIS — R928 Other abnormal and inconclusive findings on diagnostic imaging of breast: Secondary | ICD-10-CM

## 2019-10-11 ENCOUNTER — Other Ambulatory Visit: Payer: No Typology Code available for payment source

## 2019-10-19 ENCOUNTER — Inpatient Hospital Stay: Admission: RE | Admit: 2019-10-19 | Payer: No Typology Code available for payment source | Source: Ambulatory Visit

## 2019-11-02 ENCOUNTER — Inpatient Hospital Stay: Admission: RE | Admit: 2019-11-02 | Payer: No Typology Code available for payment source | Source: Ambulatory Visit

## 2019-11-13 ENCOUNTER — Other Ambulatory Visit: Payer: Self-pay | Admitting: Internal Medicine

## 2019-11-14 ENCOUNTER — Other Ambulatory Visit: Payer: Self-pay | Admitting: Internal Medicine

## 2019-11-16 ENCOUNTER — Inpatient Hospital Stay: Admission: RE | Admit: 2019-11-16 | Payer: No Typology Code available for payment source | Source: Ambulatory Visit

## 2019-11-29 ENCOUNTER — Other Ambulatory Visit: Payer: Self-pay | Admitting: Internal Medicine

## 2019-11-30 ENCOUNTER — Ambulatory Visit: Payer: No Typology Code available for payment source

## 2019-12-05 ENCOUNTER — Other Ambulatory Visit: Payer: Self-pay | Admitting: Internal Medicine

## 2019-12-14 ENCOUNTER — Other Ambulatory Visit: Payer: Self-pay

## 2019-12-14 ENCOUNTER — Ambulatory Visit
Admission: RE | Admit: 2019-12-14 | Discharge: 2019-12-14 | Disposition: A | Discharge status: Home / Self Care | Payer: No Typology Code available for payment source | Source: Ambulatory Visit | Attending: Internal Medicine | Admitting: Internal Medicine

## 2019-12-14 ENCOUNTER — Other Ambulatory Visit: Payer: Self-pay | Admitting: Internal Medicine

## 2019-12-14 DIAGNOSIS — R928 Other abnormal and inconclusive findings on diagnostic imaging of breast: Secondary | ICD-10-CM

## 2020-06-13 ENCOUNTER — Other Ambulatory Visit: Payer: Self-pay

## 2020-06-13 ENCOUNTER — Other Ambulatory Visit: Payer: Self-pay | Admitting: Internal Medicine

## 2020-06-13 ENCOUNTER — Ambulatory Visit
Admission: RE | Admit: 2020-06-13 | Discharge: 2020-06-13 | Disposition: A | Discharge status: Home / Self Care | Payer: No Typology Code available for payment source | Source: Ambulatory Visit | Attending: Internal Medicine | Admitting: Internal Medicine

## 2020-06-13 DIAGNOSIS — N63 Unspecified lump in unspecified breast: Secondary | ICD-10-CM

## 2020-06-13 DIAGNOSIS — R928 Other abnormal and inconclusive findings on diagnostic imaging of breast: Secondary | ICD-10-CM

## 2020-10-10 ENCOUNTER — Inpatient Hospital Stay: Admission: RE | Admit: 2020-10-10 | Payer: No Typology Code available for payment source | Source: Ambulatory Visit

## 2021-03-02 ENCOUNTER — Other Ambulatory Visit: Payer: Self-pay | Admitting: Internal Medicine

## 2021-03-02 DIAGNOSIS — N63 Unspecified lump in unspecified breast: Secondary | ICD-10-CM

## 2021-04-09 ENCOUNTER — Ambulatory Visit
Admission: RE | Admit: 2021-04-09 | Discharge: 2021-04-09 | Disposition: A | Discharge status: Home / Self Care | Payer: No Typology Code available for payment source | Source: Ambulatory Visit | Attending: Internal Medicine | Admitting: Internal Medicine

## 2021-04-09 ENCOUNTER — Other Ambulatory Visit: Payer: Self-pay | Admitting: Internal Medicine

## 2021-04-09 ENCOUNTER — Other Ambulatory Visit: Payer: Self-pay

## 2021-04-09 DIAGNOSIS — N63 Unspecified lump in unspecified breast: Secondary | ICD-10-CM

## 2021-04-16 ENCOUNTER — Ambulatory Visit
Admission: RE | Admit: 2021-04-16 | Discharge: 2021-04-16 | Disposition: A | Discharge status: Home / Self Care | Payer: No Typology Code available for payment source | Source: Ambulatory Visit | Attending: Internal Medicine | Admitting: Internal Medicine

## 2021-04-16 DIAGNOSIS — N63 Unspecified lump in unspecified breast: Secondary | ICD-10-CM

## 2021-04-16 HISTORY — PX: BREAST BIOPSY: SHX20

## 2021-04-17 ENCOUNTER — Other Ambulatory Visit: Payer: Self-pay | Admitting: Internal Medicine

## 2021-04-17 DIAGNOSIS — N6310 Unspecified lump in the right breast, unspecified quadrant: Secondary | ICD-10-CM

## 2021-04-20 ENCOUNTER — Telehealth: Payer: Self-pay | Admitting: Hematology and Oncology

## 2021-04-20 NOTE — Telephone Encounter (Signed)
Left message for patient to return my call in reference to upcoming clinic appointment for 2/1

## 2021-04-24 ENCOUNTER — Telehealth: Payer: Self-pay | Admitting: Hematology and Oncology

## 2021-04-24 NOTE — Telephone Encounter (Signed)
Spoke with patient to confirm morning clinic appointment for 2/1, paperwork sent via- email

## 2021-04-27 ENCOUNTER — Other Ambulatory Visit: Payer: Self-pay | Admitting: Diagnostic Radiology

## 2021-04-27 ENCOUNTER — Encounter: Payer: Self-pay | Admitting: *Deleted

## 2021-04-27 ENCOUNTER — Ambulatory Visit
Admission: RE | Admit: 2021-04-27 | Discharge: 2021-04-27 | Disposition: A | Discharge status: Home / Self Care | Payer: No Typology Code available for payment source | Source: Ambulatory Visit | Attending: Internal Medicine | Admitting: Internal Medicine

## 2021-04-27 DIAGNOSIS — N6310 Unspecified lump in the right breast, unspecified quadrant: Secondary | ICD-10-CM

## 2021-04-27 DIAGNOSIS — D0512 Intraductal carcinoma in situ of left breast: Secondary | ICD-10-CM | POA: Insufficient documentation

## 2021-04-27 HISTORY — PX: BREAST BIOPSY: SHX20

## 2021-04-27 NOTE — Progress Notes (Addendum)
Radiation Oncology         [phone] ________________________________  Name: Theresa Fisher        MRN: [id number]  Date of Service: 04/29/2021 DOB: 05-14-1970  CC:Theresa Gross, MD  Theresa Luna, MD     REFERRING PHYSICIAN: Erroll Luna, MD   DIAGNOSIS: The encounter diagnosis was Ductal carcinoma in situ (DCIS) of left breast.   HISTORY OF PRESENT ILLNESS: Theresa Fisher is a 51 y.o. female seen in the multidisciplinary breast clinic for a new diagnosis of left breast cancer. The patient was noted to have an abnormality in the right breast that was biopsied and benign and during her follow-up diagnostic mammogram a mass was seen in the left breast.  By additional ultrasound imaging this was noted in the 11 o'clock position and measured up to 6 mm.  She did have mild nodal enlargement in the left axilla and subsequently underwent just double check that Biopsies on 04/16/2021 that showed a grade 2-3 ductal carcinoma in situ with no evidence of invasive disease.  Her left axillary lymph node was benign.  Her tumor was ER/PR positive.  An additional lesion in the right breast also underwent biopsy on 04/27/2021 that was benign.  She is seen to discuss treatment recommendations of her cancer.    PREVIOUS RADIATION THERAPY: No   PAST MEDICAL HISTORY:  Past Medical History:  Diagnosis Date   Breast cancer ([place])    Hyperlipidemia    Hypertension 03/29/2006   Lupus (systemic lupus erythematosus) ([place])        PAST SURGICAL HISTORY: Past Surgical History:  Procedure Laterality Date   BREAST BIOPSY Left 04/16/2021   US biopsy   BREAST BIOPSY Right 04/27/2021   US biopsy   cardiac window  01/27/1998   mastopexy and augmentation  06/27/2004     FAMILY HISTORY:  Family History  Problem Relation Age of Onset   Hyperlipidemia Mother    Cancer Other    Hypertension Other      SOCIAL HISTORY:  reports that she has never smoked. She has never used smokeless  tobacco. She reports current alcohol use. She reports that she does not use drugs.  The patient is divorced and lives in [place].  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 She has an adult daughter who is a Paediatric nurse in [place], [place].    ALLERGIES: Penciclovir, Penicillins, and Solbar pf spf15   MEDICATIONS:  Current Outpatient Medications  Medication Sig Dispense Refill   amLODipine (NORVASC) 5 MG tablet TAKE 1 TABLET (5 MG TOTAL) BY MOUTH DAILY. 90 tablet 2   atorvastatin (LIPITOR) 40 MG tablet Take 1 tablet (40 mg total) by mouth daily. 90 tablet 2   hydrochlorothiazide (MICROZIDE) 12.5 MG capsule Take 1 capsule (12.5 mg total) by mouth daily. Take 1 capsule (12.5 total) by mouth daily - OFFICE APPOINTMENT NECESSARY FOR REFILLS 30 capsule 0   hydroxychloroquine (PLAQUENIL) 200 MG tablet Take 1 tablet (200 mg total) by mouth 2 (two) times daily.     trimethoprim-polymyxin b (POLYTRIM) ophthalmic solution 2 drop in affected eye(s) every 6 hours (while awake) x 7 days 10 mL 0   No current facility-administered medications for this encounter.     REVIEW OF SYSTEMS: On review of systems, the patient reports that she is doing well overall. She denies any specific symptoms pertaining to the breast. She denies any active lupus symptoms but in the past with flares has had some  fullness in her skin of her chest, fatigue, and has been stable on Plaquenil and is followed by Theresa Fisher, PAC at [facility]. No other complaints are noted.      PHYSICAL EXAM:  Wt Readings from Last 3 Encounters:  04/29/21 132 lb 8 oz (60.1 kg)  11/23/15 147 lb 4 oz (66.8 kg)  12/07/12 135 lb (61.2 kg)   Temp Readings from Last 3 Encounters:  04/29/21 97.7 F (36.5 C) (Temporal)  11/23/15 98.2 F (36.8 C) (Oral)   BP Readings from Last 3 Encounters:  04/29/21 (!) 144/63  11/23/15 154/82  12/07/12 137/76   Pulse Readings from Last 3 Encounters:  04/29/21 (!)  101  11/23/15 85  12/07/12 90    In general this is a well appearing African-American female in no acute distress. She's alert and oriented x4 and appropriate throughout the examination. Cardiopulmonary assessment is negative for acute distress and she exhibits normal effort. Bilateral breast exam is deferred.    ECOG = 0  0 - Asymptomatic (Fully active, able to carry on all predisease activities without restriction)  1 - Symptomatic but completely ambulatory (Restricted in physically strenuous activity but ambulatory and able to carry out work of a light or sedentary nature. For example, light housework, office work)  2 - Symptomatic, <50% in bed during the day (Ambulatory and capable of all self care but unable to carry out any work activities. Up and about more than 50% of waking hours)  3 - Symptomatic, >50% in bed, but not bedbound (Capable of only limited self-care, confined to bed or chair 50% or more of waking hours)  4 - Bedbound (Completely disabled. Cannot carry on any self-care. Totally confined to bed or chair)  5 - Death   Eustace Pen MM, Creech RH, Tormey DC, et al. [phone]). "Toxicity and response criteria of the [facility]". [place] Oncol. 5 (6): 649-55    LABORATORY DATA:  Lab Results  Component Value Date   WBC 6.2 04/29/2021   HGB 13.1 04/29/2021   HCT 37.6 04/29/2021   MCV 78.7 (L) 04/29/2021   PLT 245 04/29/2021   Lab Results  Component Value Date   NA 139 04/29/2021   K 4.2 04/29/2021   CL 103 04/29/2021   CO2 30 04/29/2021   Lab Results  Component Value Date   ALT 20 04/29/2021   AST 20 04/29/2021   ALKPHOS 75 04/29/2021   BILITOT 0.4 04/29/2021      RADIOGRAPHY: US BREAST LTD UNI LEFT INC AXILLA  Result Date: 04/09/2021 CLINICAL DATA:  Patient presents for follow-up of probably benign right breast mass. EXAM: DIGITAL DIAGNOSTIC BILATERAL MAMMOGRAM WITH IMPLANTS, CAD AND TOMOSYNTHESIS; ULTRASOUND LEFT BREAST LIMITED;  ULTRASOUND RIGHT BREAST LIMITED TECHNIQUE: Bilateral digital diagnostic mammography and breast tomosynthesis was performed. The images were evaluated with computer-aided detection. Standard and/or implant displaced views were performed.; Targeted ultrasound examination of the left breast was performed.; Targeted ultrasound examination of the right breast was performed COMPARISON:  Previous exam(s). ACR Breast Density Category c: The breast tissue is heterogeneously dense, which may obscure small masses. FINDINGS: There is a persistent oval circumscribed mass within the anterior right breast. Within the central posterior left breast there is a persistent lobular mass with punctate calcifications. No additional concerning findings in either breast. The patient has retropectoral implants. Targeted ultrasound is performed, showing a 5 x 3 x 6 mm irregular hypoechoic mass left breast 11 o'clock position 4 cm from nipple. There are  a few mildly enlarged left axillary lymph nodes measuring up to 5 mm. Unchanged 5 x 5 x 4 mm oval circumscribed hypoechoic mass right breast 5:30 o'clock 2 cm from the nipple, previously 5 x 5 x 5 mm. IMPRESSION: 1. New indeterminate left breast mass 11 o'clock position. 2. Mildly enlarged left axillary lymph nodes. 3. Stable probably benign right breast mass 5:30 o'clock. RECOMMENDATION: 1. Ultrasound-guided core needle biopsy new left breast mass 11 o'clock position. 2. Ultrasound-guided core needle biopsy of 1 of the enlarged left axillary lymph nodes. While these may potentially be reactive from the history of lupus, patient desires to have biopsy at the same time as the breast mass for definitive characterization. 3. If this demonstrates benign pathology, recommend 1 additional follow-up in 1 year of the right breast mass. I have discussed the findings and recommendations with the patient. If applicable, a reminder letter will be sent to the patient regarding the next appointment. BI-RADS  CATEGORY  4: Suspicious. Electronically Signed   By: Lovey Newcomer M.D.   On: 04/09/2021 09:50  US BREAST LTD UNI RIGHT INC AXILLA  Result Date: 04/09/2021 CLINICAL DATA:  Patient presents for follow-up of probably benign right breast mass. EXAM: DIGITAL DIAGNOSTIC BILATERAL MAMMOGRAM WITH IMPLANTS, CAD AND TOMOSYNTHESIS; ULTRASOUND LEFT BREAST LIMITED; ULTRASOUND RIGHT BREAST LIMITED TECHNIQUE: Bilateral digital diagnostic mammography and breast tomosynthesis was performed. The images were evaluated with computer-aided detection. Standard and/or implant displaced views were performed.; Targeted ultrasound examination of the left breast was performed.; Targeted ultrasound examination of the right breast was performed COMPARISON:  Previous exam(s). ACR Breast Density Category c: The breast tissue is heterogeneously dense, which may obscure small masses. FINDINGS: There is a persistent oval circumscribed mass within the anterior right breast. Within the central posterior left breast there is a persistent lobular mass with punctate calcifications. No additional concerning findings in either breast. The patient has retropectoral implants. Targeted ultrasound is performed, showing a 5 x 3 x 6 mm irregular hypoechoic mass left breast 11 o'clock position 4 cm from nipple. There are a few mildly enlarged left axillary lymph nodes measuring up to 5 mm. Unchanged 5 x 5 x 4 mm oval circumscribed hypoechoic mass right breast 5:30 o'clock 2 cm from the nipple, previously 5 x 5 x 5 mm. IMPRESSION: 1. New indeterminate left breast mass 11 o'clock position. 2. Mildly enlarged left axillary lymph nodes. 3. Stable probably benign right breast mass 5:30 o'clock. RECOMMENDATION: 1. Ultrasound-guided core needle biopsy new left breast mass 11 o'clock position. 2. Ultrasound-guided core needle biopsy of 1 of the enlarged left axillary lymph nodes. While these may potentially be reactive from the history of lupus, patient desires to  have biopsy at the same time as the breast mass for definitive characterization. 3. If this demonstrates benign pathology, recommend 1 additional follow-up in 1 year of the right breast mass. I have discussed the findings and recommendations with the patient. If applicable, a reminder letter will be sent to the patient regarding the next appointment. BI-RADS CATEGORY  4: Suspicious. Electronically Signed   By: Lovey Newcomer M.D.   On: 04/09/2021 09:50  MM DIAG BREAST W/IMPLANT TOMO BILATERAL  Result Date: 04/09/2021 CLINICAL DATA:  Patient presents for follow-up of probably benign right breast mass. EXAM: DIGITAL DIAGNOSTIC BILATERAL MAMMOGRAM WITH IMPLANTS, CAD AND TOMOSYNTHESIS; ULTRASOUND LEFT BREAST LIMITED; ULTRASOUND RIGHT BREAST LIMITED TECHNIQUE: Bilateral digital diagnostic mammography and breast tomosynthesis was performed. The images were evaluated with computer-aided detection. Standard and/or implant displaced  views were performed.; Targeted ultrasound examination of the left breast was performed.; Targeted ultrasound examination of the right breast was performed COMPARISON:  Previous exam(s). ACR Breast Density Category c: The breast tissue is heterogeneously dense, which may obscure small masses. FINDINGS: There is a persistent oval circumscribed mass within the anterior right breast. Within the central posterior left breast there is a persistent lobular mass with punctate calcifications. No additional concerning findings in either breast. The patient has retropectoral implants. Targeted ultrasound is performed, showing a 5 x 3 x 6 mm irregular hypoechoic mass left breast 11 o'clock position 4 cm from nipple. There are a few mildly enlarged left axillary lymph nodes measuring up to 5 mm. Unchanged 5 x 5 x 4 mm oval circumscribed hypoechoic mass right breast 5:30 o'clock 2 cm from the nipple, previously 5 x 5 x 5 mm. IMPRESSION: 1. New indeterminate left breast mass 11 o'clock position. 2. Mildly  enlarged left axillary lymph nodes. 3. Stable probably benign right breast mass 5:30 o'clock. RECOMMENDATION: 1. Ultrasound-guided core needle biopsy new left breast mass 11 o'clock position. 2. Ultrasound-guided core needle biopsy of 1 of the enlarged left axillary lymph nodes. While these may potentially be reactive from the history of lupus, patient desires to have biopsy at the same time as the breast mass for definitive characterization. 3. If this demonstrates benign pathology, recommend 1 additional follow-up in 1 year of the right breast mass. I have discussed the findings and recommendations with the patient. If applicable, a reminder letter will be sent to the patient regarding the next appointment. BI-RADS CATEGORY  4: Suspicious. Electronically Signed   By: Lovey Newcomer M.D.   On: 04/09/2021 09:50  [place] AXILLARY NODE CORE BIOPSY LEFT  Addendum Date: 04/20/2021   ADDENDUM REPORT: 04/20/2021 08:28 ADDENDUM: Pathology revealed GRADE II-III DUCTAL CARCINOMA IN SITU of the LEFT breast, 11:00 o'clock, 4cmfn, (ribbon clip). This was found to be concordant by Dr. Ammie Ferrier. Pathology revealed BENIGN LYMPH NODE of the LEFT axilla (tribell clip). This was found to be concordant by Dr. Ammie Ferrier. Pathology results were discussed with the patient by telephone. The patient reported doing well after the biopsies with tenderness at the sites. Post biopsy instructions and care were reviewed and questions were answered. The patient was encouraged to call [facility] for any additional concerns. My direct phone number was provided. The patient is scheduled for a RIGHT breast ultrasound guided biopsy on April 27, 2021. Further recommendations will be guided by the results of this biopsy. The patient was referred to [facility] on April 29, 2021, per patient request. Pathology results reported by Terie Purser, RN on 04/20/2021. Electronically Signed   By: Ammie Ferrier M.D.   On: 04/20/2021 08:28   Result Date: 04/20/2021 CLINICAL DATA:  51 year old female presenting for ultrasound-guided biopsy of a left breast mass and left axillary lymph node. EXAM: ULTRASOUND GUIDED LEFT BREAST CORE NEEDLE BIOPSY COMPARISON:  Previous exam(s). PROCEDURE: I met with the patient and we discussed the procedure of ultrasound-guided biopsy, including benefits and alternatives. We discussed the high likelihood of a successful procedure. We discussed the risks of the procedure, including infection, bleeding, tissue injury, clip migration, and inadequate sampling. Informed written consent was given. The usual time-out protocol was performed immediately prior to the procedure. #1 Lesion quadrant: Upper inner quadrant Using sterile technique and 1% Lidocaine as local anesthetic, under direct ultrasound visualization,  a 14 gauge spring-loaded device was used to perform biopsy of a mass in the left breast at 11 o'clock, 4 cm from the nipple using a lateral approach. At the conclusion of the procedure a ribbon shaped tissue marker clip was deployed into the biopsy cavity. --------------------------------------------------------------------------- ----------------------------------------------------------------- #2 Lesion quadrant: Left axilla Using sterile technique and 1% Lidocaine as local anesthetic, under direct ultrasound visualization, a 14 gauge spring-loaded device was used to perform biopsy of a left axillary lymph node using an inferior approach. At the conclusion of the procedure a tribell tissue marker clip was deployed into the biopsy cavity. Follow up 2 view mammogram was performed and dictated separately. IMPRESSION: 1. Ultrasound guided biopsy of a left breast mass at 11 o'clock. No apparent complications. 2. Ultrasound guided biopsy of a left axillary lymph node. No apparent complications. Electronically Signed: By:  Ammie Ferrier M.D. On: 04/16/2021 08:42  MM CLIP PLACEMENT LEFT  Result Date: 04/16/2021 CLINICAL DATA:  Post biopsy mammogram of the left breast for clip placement. EXAM: 3D DIAGNOSTIC LEFT MAMMOGRAM POST ULTRASOUND BIOPSY COMPARISON:  Previous exam(s). FINDINGS: 3D Mammographic images were obtained following ultrasound guided biopsy of a left breast mass and a left axillary lymph node. The biopsy marking clips are in expected position at the site of biopsy. IMPRESSION: 1. Appropriate positioning of the ribbon shaped biopsy marking clip at the site of biopsy in the upper inner left breast. 2. Appropriate positioning the tribell biopsy marking clip left axilla. Final Assessment: Post Procedure Mammograms for Marker Placement Electronically Signed   By: Ammie Ferrier M.D.   On: 04/16/2021 09:00  MM CLIP PLACEMENT RIGHT  Result Date: 04/27/2021 CLINICAL DATA:  Status post ultrasound-guided biopsy of a RIGHT breast mass at the 5:30 o'clock axis. EXAM: 3D DIAGNOSTIC RIGHT MAMMOGRAM POST ULTRASOUND BIOPSY COMPARISON:  Previous exam(s). FINDINGS: 3D Mammographic images were obtained following ultrasound guided biopsy of the RIGHT breast mass at the 5:30 o'clock axis. The biopsy marking clip is in expected position at the site of biopsy. IMPRESSION: Appropriate positioning of the ribbon shaped biopsy marking clip at the site of biopsy in the lower inner quadrant of the RIGHT breast corresponding to the targeted mass at the 5:30 o'clock axis. Final Assessment: Post Procedure Mammograms for Marker Placement Electronically Signed   By: Franki Cabot M.D.   On: 04/27/2021 10:17  [place] LT BREAST BX W LOC DEV 1ST LESION IMG BX SPEC US GUIDE  Addendum Date: 04/20/2021   ADDENDUM REPORT: 04/20/2021 08:28 ADDENDUM: Pathology revealed GRADE II-III DUCTAL CARCINOMA IN SITU of the LEFT breast, 11:00 o'clock, 4cmfn, (ribbon clip). This was found to be concordant by Dr. Ammie Ferrier. Pathology revealed BENIGN LYMPH  NODE of the LEFT axilla (tribell clip). This was found to be concordant by Dr. Ammie Ferrier. Pathology results were discussed with the patient by telephone. The patient reported doing well after the biopsies with tenderness at the sites. Post biopsy instructions and care were reviewed and questions were answered. The patient was encouraged to call [facility] for any additional concerns. My direct phone number was provided. The patient is scheduled for a RIGHT breast ultrasound guided biopsy on April 27, 2021. Further recommendations will be guided by the results of this biopsy. The patient was referred to [facility] on April 29, 2021, per patient request. Pathology results reported by Terie Purser, RN on 04/20/2021. Electronically Signed   By: Ammie Ferrier  M.D.   On: 04/20/2021 08:28   Result Date: 04/20/2021 CLINICAL DATA:  51 year old female presenting for ultrasound-guided biopsy of a left breast mass and left axillary lymph node. EXAM: ULTRASOUND GUIDED LEFT BREAST CORE NEEDLE BIOPSY COMPARISON:  Previous exam(s). PROCEDURE: I met with the patient and we discussed the procedure of ultrasound-guided biopsy, including benefits and alternatives. We discussed the high likelihood of a successful procedure. We discussed the risks of the procedure, including infection, bleeding, tissue injury, clip migration, and inadequate sampling. Informed written consent was given. The usual time-out protocol was performed immediately prior to the procedure. #1 Lesion quadrant: Upper inner quadrant Using sterile technique and 1% Lidocaine as local anesthetic, under direct ultrasound visualization, a 14 gauge spring-loaded device was used to perform biopsy of a mass in the left breast at 11 o'clock, 4 cm from the nipple using a lateral approach. At the conclusion of the procedure a ribbon shaped tissue marker clip  was deployed into the biopsy cavity. --------------------------------------------------------------------------- ----------------------------------------------------------------- #2 Lesion quadrant: Left axilla Using sterile technique and 1% Lidocaine as local anesthetic, under direct ultrasound visualization, a 14 gauge spring-loaded device was used to perform biopsy of a left axillary lymph node using an inferior approach. At the conclusion of the procedure a tribell tissue marker clip was deployed into the biopsy cavity. Follow up 2 view mammogram was performed and dictated separately. IMPRESSION: 1. Ultrasound guided biopsy of a left breast mass at 11 o'clock. No apparent complications. 2. Ultrasound guided biopsy of a left axillary lymph node. No apparent complications. Electronically Signed: By: Ammie Ferrier M.D. On: 04/16/2021 08:42  [place] RT BREAST BX W LOC DEV 1ST LESION IMG BX SPEC US GUIDE  Addendum Date: 04/28/2021   ADDENDUM REPORT: 04/28/2021 14:38 ADDENDUM: Pathology revealed NODULAR ADENOSIS, FEATURES SUGGESTIVE OF PREVIOUS CYSTIC RUPTURE of the RIGHT breast, 5:30 o'clock, (ribbon clip). This was found to be concordant by Dr. Franki Cabot. Pathology results were discussed with the patient by telephone. The patient reported doing well after the biopsy with tenderness at the site. Post biopsy instructions and care were reviewed and questions were answered. The patient was encouraged to call [facility] for any additional concerns. My direct phone number was provided. The patient has a recent diagnosis of LEFT breast cancer and was referred to [facility] on May 06, 2021. Pathology results reported by Terie Purser, RN on 04/28/2021. Electronically Signed   By: Franki Cabot M.D.   On: 04/28/2021 14:38   Result Date: 04/28/2021 CLINICAL DATA:  Patient with a RIGHT breast mass at the 5:30  o'clock axis presents today for ultrasound-guided core biopsy. Recent biopsy-proven DCIS within the LEFT breast. EXAM: ULTRASOUND GUIDED RIGHT BREAST CORE NEEDLE BIOPSY COMPARISON:  Previous exam(s). PROCEDURE: I met with the patient and we discussed the procedure of ultrasound-guided biopsy, including benefits and alternatives. We discussed the high likelihood of a successful procedure. We discussed the risks of the procedure, including infection, bleeding, tissue injury, clip migration, and inadequate sampling. Informed written consent was given. The usual time-out protocol was performed immediately prior to the procedure. Lesion quadrant: Lower inner quadrant Using sterile technique and 1% Lidocaine as local anesthetic, under direct ultrasound visualization, a 12 gauge spring-loaded device was used to perform biopsy of the RIGHT breast mass at the 5:30 o'clock axis using a inferolateral approach. At the conclusion of the procedure , a ribbon shaped tissue marker clip was deployed into  the biopsy cavity. Follow up 2 view mammogram was performed and dictated separately. IMPRESSION: Ultrasound guided biopsy of the RIGHT breast mass at the 5:30 o'clock axis. No apparent complications. Electronically Signed: By: Franki Cabot M.D. On: 04/27/2021 10:03      IMPRESSION/PLAN: 1. Intermediate to high grade, ER/PR positive DCIS of the left breast. Dr. Lisbeth Renshaw discusses the pathology findings and reviews the nature of noninvasive left breast disease. The consensus from the breast conference includes breast conservation with lumpectomy.  Dr. Lisbeth Renshaw recommends external radiotherapy to the breast  to reduce risks of local recurrence followed by antiestrogen therapy. We discussed the risks, benefits, short, and long term effects of radiotherapy, as well as the curative intent, and the patient is interested in proceeding. Dr. Lisbeth Renshaw discusses the delivery and logistics of radiotherapy and anticipates a course of 4 weeks of  radiotherapy to the left breast with deep inspiration breath-hold technique. We will see her back a few weeks after surgery to discuss the simulation process and anticipate we starting radiotherapy about 4-6 weeks after surgery.  2. Possible genetic predisposition to malignancy. The patient is a candidate for genetic testing given her personal and family history.  She was offered referral and agreed to meeting genetics today in clinic.  3. SLE. The patient has been stable on Plaquenil and follows with Theresa Fisher, PAC at [facility]. We will reach out as well to him, but Dr. Lisbeth Renshaw does not feel that her diagnosis prohibits the use of radiotherapy, but could increase the risk of a flare of her SLE. If she had a flare, she could use corticosteroids to manage her symptoms.   In a visit lasting 60 minutes, greater than 50% of the time was spent face to face reviewing her case, as well as in preparation of, discussing, and coordinating the patient's care.  The above documentation reflects my direct findings during this shared patient visit. Please see the separate note by Dr. Lisbeth Renshaw on this date for the remainder of the patient's plan of care.    Carola Rhine, [facility]    **Disclaimer: This note was dictated with voice recognition software. Similar sounding words can inadvertently be transcribed and this note may contain transcription errors which may not have been corrected upon publication of note.**

## 2021-04-29 ENCOUNTER — Ambulatory Visit (HOSPITAL_BASED_OUTPATIENT_CLINIC_OR_DEPARTMENT_OTHER): Payer: No Typology Code available for payment source | Admitting: Genetic Counselor

## 2021-04-29 ENCOUNTER — Ambulatory Visit: Payer: No Typology Code available for payment source | Admitting: Physical Therapy

## 2021-04-29 ENCOUNTER — Inpatient Hospital Stay (HOSPITAL_BASED_OUTPATIENT_CLINIC_OR_DEPARTMENT_OTHER): Payer: No Typology Code available for payment source | Admitting: Hematology and Oncology

## 2021-04-29 ENCOUNTER — Ambulatory Visit
Admission: RE | Admit: 2021-04-29 | Discharge: 2021-04-29 | Disposition: A | Discharge status: Home / Self Care | Payer: No Typology Code available for payment source | Source: Ambulatory Visit | Attending: Radiation Oncology | Admitting: Radiation Oncology

## 2021-04-29 ENCOUNTER — Encounter: Payer: Self-pay | Admitting: Hematology and Oncology

## 2021-04-29 ENCOUNTER — Other Ambulatory Visit: Payer: Self-pay

## 2021-04-29 ENCOUNTER — Inpatient Hospital Stay: Payer: No Typology Code available for payment source | Admitting: Licensed Clinical Social Worker

## 2021-04-29 ENCOUNTER — Ambulatory Visit: Payer: Self-pay | Admitting: Surgery

## 2021-04-29 ENCOUNTER — Inpatient Hospital Stay: Payer: No Typology Code available for payment source | Attending: Hematology and Oncology

## 2021-04-29 DIAGNOSIS — D0512 Intraductal carcinoma in situ of left breast: Secondary | ICD-10-CM

## 2021-04-29 DIAGNOSIS — I10 Essential (primary) hypertension: Secondary | ICD-10-CM

## 2021-04-29 DIAGNOSIS — Z808 Family history of malignant neoplasm of other organs or systems: Secondary | ICD-10-CM

## 2021-04-29 DIAGNOSIS — Z809 Family history of malignant neoplasm, unspecified: Secondary | ICD-10-CM | POA: Insufficient documentation

## 2021-04-29 LAB — CBC WITH DIFFERENTIAL (CANCER CENTER ONLY)
Abs Immature Granulocytes: 0.02 10*3/uL (ref 0.00–0.07)
Basophils Absolute: 0.1 10*3/uL (ref 0.0–0.1)
Basophils Relative: 1 %
Eosinophils Absolute: 0.1 10*3/uL (ref 0.0–0.5)
Eosinophils Relative: 1 %
HCT: 37.6 % (ref 36.0–46.0)
Hemoglobin: 13.1 g/dL (ref 12.0–15.0)
Immature Granulocytes: 0 %
Lymphocytes Relative: 29 %
Lymphs Abs: 1.8 10*3/uL (ref 0.7–4.0)
MCH: 27.4 pg (ref 26.0–34.0)
MCHC: 34.8 g/dL (ref 30.0–36.0)
MCV: 78.7 fL — ABNORMAL LOW (ref 80.0–100.0)
Monocytes Absolute: 0.5 10*3/uL (ref 0.1–1.0)
Monocytes Relative: 8 %
Neutro Abs: 3.8 10*3/uL (ref 1.7–7.7)
Neutrophils Relative %: 61 %
Platelet Count: 245 10*3/uL (ref 150–400)
RBC: 4.78 MIL/uL (ref 3.87–5.11)
RDW: 14.6 % (ref 11.5–15.5)
WBC Count: 6.2 10*3/uL (ref 4.0–10.5)
nRBC: 0 % (ref 0.0–0.2)

## 2021-04-29 LAB — CMP (CANCER CENTER ONLY)
ALT: 20 U/L (ref 0–44)
AST: 20 U/L (ref 15–41)
Albumin: 4.7 g/dL (ref 3.5–5.0)
Alkaline Phosphatase: 75 U/L (ref 38–126)
Anion gap: 6 (ref 5–15)
BUN: 21 mg/dL — ABNORMAL HIGH (ref 6–20)
CO2: 30 mmol/L (ref 22–32)
Calcium: 10.9 mg/dL — ABNORMAL HIGH (ref 8.9–10.3)
Chloride: 103 mmol/L (ref 98–111)
Creatinine: 0.81 mg/dL (ref 0.44–1.00)
GFR, Estimated: >60 mL/min (ref >60)
Glucose, Bld: 102 mg/dL — ABNORMAL HIGH (ref 70–99)
Potassium: 4.2 mmol/L (ref 3.5–5.1)
Sodium: 139 mmol/L (ref 135–145)
Total Bilirubin: 0.4 mg/dL (ref 0.3–1.2)
Total Protein: 8.1 g/dL (ref 6.5–8.1)

## 2021-04-29 LAB — GENETIC SCREENING ORDER

## 2021-04-29 NOTE — Assessment & Plan Note (Addendum)
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  Pathology review: I discussed with the patient the difference between DCIS and invasive breast cancer. It is considered a precancerous lesion. DCIS is classified as a Stage 0 breast cancer. It is generally detected through mammograms as calcifications. We also discussed the importance of ER and PR receptors and their implications to adjuvant treatment options. Prognosis of DCIS dependence on grade and degree of comedo necrosis. It is anticipated that if not treated, 20-30% of DCIS can develop into invasive breast cancer.  Recommendation: 1. Breast conserving surgery 2. Followed by adjuvant radiation therapy 3. Followed by antiestrogen therapy with tamoxifen/aromatase inhibitors based on menopausal status 5 years  Tamoxifen counseling: We discussed the risks and benefits of tamoxifen. These include but not limited to insomnia, hot flashes, mood changes, vaginal dryness, and weight gain. Although rare, serious side effects including endometrial cancer, risk of blood clots were also discussed. We strongly believe that the benefits far outweigh the risks. Patient understands these risks and consented to starting treatment. Planned treatment duration is 5 years.  Aromatase inhibitors counseling: We have discussed the mechanism of action of aromatase inhibitors today.  We have discussed adverse effects including but not limited to menopausal symptoms, increased risk of osteoporosis and fractures, cardiovascular events, arthralgias and myalgias.  We do believe that the benefits far outweigh the risks.  Plan treatment duration of 5 years.  Given her postmenopausal status, she can proceed with tamoxifen or aromatase inhibitors for adjuvant options.  We have also discussed about the DCIS blood collection study today and she agreed for the blood  draw.  All questions were answered. The patient knows to call the clinic with any problems, questions or concerns.  We will plan to see her after completion of radiation unless there is any invasive competent noticed on the final pathology specimen.

## 2021-04-29 NOTE — Progress Notes (Signed)
[place] Work  Initial Assessment   Theresa Fisher is a 51 y.o. year old female presenting alone. Clinical Social Work was referred by [facility] for assessment of psychosocial needs.   SDOH (Social Determinants of Health) assessments performed: Yes SDOH Interventions    Flowsheet Row Most Recent Value  SDOH Interventions   Food Insecurity Interventions Intervention Not Indicated  Financial Strain Interventions Intervention Not Indicated  Housing Interventions Intervention Not Indicated  Transportation Interventions Intervention Not Indicated       Distress Screen completed: Yes ONCBCN DISTRESS SCREENING 04/29/2021  Screening Type Initial Screening  Distress experienced in past week (1-10) 5  Emotional problem type Adjusting to illness  Information Concerns Type Lack of info about diagnosis;Lack of info about treatment;Lack of info about complementary therapy choices      Family/Social Information:  Housing Arrangement: patient lives alone. Adult daughter (who is a Engineer, drilling) lives in [place] Family members/support persons in your life? 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 Income source: Employment Financial concerns: No Type of concern: None Food access concerns: no Religious or spiritual practice: yes, faith is important to patient and she is involved in her church community Services Currently in place:  n/a  Coping/ Adjustment to diagnosis: Patient understands treatment plan and what happens next? yes, feels better after receiving more information today. Has dealt with other health concerns (lupus), so she knows she has tools to manage and supports available Concerns about diagnosis and/or treatment:  General adjustment to diagnosis Patient reported stressors: Adjusting to my illness Patient enjoys gardening, being outside, time with family/  friends, and volunteering with church Current coping skills/ strengths: Average or above average intelligence , Capable of independent living , Motivation for treatment/growth , Religious Affiliation , and Supportive family/friends     SUMMARY: Current SDOH Barriers:  No significant SDOH concerns at this time  Clinical Social Work Clinical Goal(s):  No clinical SW goals at this time  Interventions: Discussed common feeling and emotions when being diagnosed with cancer, and the importance of support during treatment Informed patient of the support team roles and support services at [facility] Provided [place] contact information and encouraged patient to call with any questions or concerns   Follow Up Plan: Patient will contact CSW with any support or resource needs Patient verbalizes understanding of plan: Yes    Christeen Douglas LCSW

## 2021-04-29 NOTE — Progress Notes (Signed)
[facility] NOTE  Patient Care Team: [name], MD as PCP - General (Internal Medicine) Dr. Jerene Pitch (Rheumatology) Erroll Luna, MD as Consulting Physician (General Surgery) Benay Pike, MD as Consulting Physician (Hematology and Oncology) Kyung Rudd, MD as Consulting Physician (Radiation Oncology) Mauro Kaufmann, RN as Oncology Nurse Navigator Rockwell Germany, RN as Oncology Nurse Navigator  CHIEF COMPLAINTS/PURPOSE OF CONSULTATION:  Newly diagnosed breast cancer  HISTORY OF PRESENTING ILLNESS:  Theresa Fisher 51 y.o. female is here because of recent diagnosis of left breast cancer.  Patient presented for follow up of probably benign mass in the right breast. There is a persistent oval circumscribed mass within the anterior right breast.  Within the central posterior left breast there is a persistent lobular mass with punctate calcifications.  No additional findings in either breast.  Patient has retropectoral implants.  Ultrasound was performed which showed a 5 x 3 x 6 mm irregular hypoechoic mass left breast 11 o'clock position 4 cm from nipple, few mildly enlarged left axillary lymph nodes measuring up to 5 mm.  Unchanged 5 x 5 x 4 oval circumscribed hypoechoic mass right breast Left breast needle core biopsy showed DCIS, grade 2-3, no invasive carcinoma left breast lymph node biopsy was benign no metastatic carcinoma identified.  DCIS prognostic showed ER 100%, positive, strong staining intensity, PR 20%, positive, moderate staining intensity.  Right breast needle core biopsy showed nodular adenosis, no malignancy identified She is here in the breast [place] to discuss recommendations. She is here for a visit by herself.  She has past medical history significant for lupus for several years and is currently on hydroxychloroquine.  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  She has some myalgias  and arthralgias from lupus.  She is otherwise healthy.  Rest of the pertinent 10 point ROS reviewed and negative.  I reviewed her records extensively and collaborated the history with the patient.  SUMMARY OF ONCOLOGIC HISTORY: Oncology History   No history exists.     MEDICAL HISTORY:  Past Medical History:  Diagnosis Date   Breast cancer ([place])    Hyperlipidemia    Hypertension 03/29/2006   Lupus (systemic lupus erythematosus) ([place])     SURGICAL HISTORY: Past Surgical History:  Procedure Laterality Date   BREAST BIOPSY Left 04/16/2021   US biopsy   BREAST BIOPSY Right 04/27/2021   US biopsy   cardiac window  01/27/1998   mastopexy and augmentation  06/27/2004    SOCIAL HISTORY: Social History   Socioeconomic History   Marital status: Divorced    Spouse name: Not on file   Number of children: Not on file   Years of education: Not on file   Highest education level: Not on file  Occupational History   Not on file  Tobacco Use   Smoking status: Never   Smokeless tobacco: Never  Substance and Sexual Activity   Alcohol use: Yes    Comment: 1-2 a year   Drug use: No   Sexual activity: Not on file  Other Topics Concern   Not on file  Social History Narrative   Not on file   Social Determinants of Health   Financial Resource Strain: Not on file  Food Insecurity: Not on file  Transportation Needs: Not on file  Physical Activity: Not on file  Stress: Not on file  Social Connections: Not on file  Intimate Partner Violence: Not on file  FAMILY HISTORY: Family History  Problem Relation Age of Onset   Hyperlipidemia Mother    Cancer Other    Hypertension Other     ALLERGIES:  is allergic to penciclovir, penicillins, and solbar pf spf15.  MEDICATIONS:  Current Outpatient Medications  Medication Sig Dispense Refill   amLODipine (NORVASC) 5 MG tablet TAKE 1 TABLET (5 MG TOTAL) BY MOUTH DAILY. 90 tablet 2   atorvastatin (LIPITOR) 40 MG tablet Take 1  tablet (40 mg total) by mouth daily. 90 tablet 2   hydrochlorothiazide (MICROZIDE) 12.5 MG capsule Take 1 capsule (12.5 mg total) by mouth daily. Take 1 capsule (12.5 total) by mouth daily - OFFICE APPOINTMENT NECESSARY FOR REFILLS 30 capsule 0   hydroxychloroquine (PLAQUENIL) 200 MG tablet Take 1 tablet (200 mg total) by mouth 2 (two) times daily.     No current facility-administered medications for this visit.    REVIEW OF SYSTEMS:   Constitutional: Denies fevers, chills or abnormal night sweats Eyes: Denies blurriness of vision, double vision or watery eyes Ears, nose, mouth, throat, and face: Denies mucositis or sore throat Respiratory: Denies cough, dyspnea or wheezes Cardiovascular: Denies palpitation, chest discomfort or lower extremity swelling Gastrointestinal:  Denies nausea, heartburn or change in bowel habits Skin: Denies abnormal skin rashes Lymphatics: Denies new lymphadenopathy or easy bruising Neurological:Denies numbness, tingling or new weaknesses Behavioral/Psych: Mood is stable, no new changes  Breast: Denies any palpable lumps or discharge All other systems were reviewed with the patient and are negative.  PHYSICAL EXAMINATION: ECOG PERFORMANCE STATUS: 0 - Asymptomatic  Vitals:   04/29/21 0923  BP: (!) 144/63  Pulse: (!) 101  Resp: 18  Temp: 97.7 F (36.5 C)  SpO2: 100%   Filed Weights   04/29/21 0923  Weight: 132 lb 8 oz (60.1 kg)    GENERAL:alert, no distress and comfortable SKIN: skin color, texture, turgor are normal, no rashes or significant lesions EYES: normal, conjunctiva are pink and non-injected, sclera clear OROPHARYNX:no exudate, no erythema and lips, buccal mucosa, and tongue normal  NECK: supple, thyroid normal size, non-tender, without nodularity LYMPH:  no palpable lymphadenopathy in the cervical, axillary or inguinal LUNGS: clear to auscultation and percussion with normal breathing effort HEART: regular rate & rhythm and no murmurs  and no lower extremity edema ABDOMEN:abdomen soft, non-tender and normal bowel sounds Musculoskeletal:no cyanosis of digits and no clubbing  PSYCH: alert & oriented x 3 with fluent speech NEURO: no focal motor/sensory deficits BREAST: Palpable left breast lump at the biopsy site, likely from hematoma.   Again palpable lump at the right breast biopsy site.  No regional adenopathy.  Likely all postbiopsy changes.   LABORATORY DATA:  I have reviewed the data as listed Lab Results  Component Value Date   WBC 6.2 04/29/2021   HGB 13.1 04/29/2021   HCT 37.6 04/29/2021   MCV 78.7 (L) 04/29/2021   PLT 245 04/29/2021   Lab Results  Component Value Date   NA 139 04/29/2021   K 4.2 04/29/2021   CL 103 04/29/2021   CO2 30 04/29/2021    RADIOGRAPHIC STUDIES: I have personally reviewed the radiological reports and agreed with the findings in the report.  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  Pathology review: I discussed with the patient the difference between DCIS and invasive  breast cancer. It is considered a precancerous lesion. DCIS is classified as a Stage 0 breast cancer. It is generally detected through mammograms as calcifications. We also discussed the importance of ER and PR receptors and their implications to adjuvant treatment options. Prognosis of DCIS dependence on grade and degree of comedo necrosis. It is anticipated that if not treated, 20-30% of DCIS can develop into invasive breast cancer.  Recommendation: 1. Breast conserving surgery 2. Followed by adjuvant radiation therapy 3. Followed by antiestrogen therapy with tamoxifen/aromatase inhibitors based on menopausal status 5 years  Tamoxifen counseling: We discussed the risks and benefits of tamoxifen. These include but  not limited to insomnia, hot flashes, mood changes, vaginal dryness, and weight gain. Although rare, serious side effects including endometrial cancer, risk of blood clots were also discussed. We strongly believe that the benefits far outweigh the risks. Patient understands these risks and consented to starting treatment. Planned treatment duration is 5 years.  Aromatase inhibitors counseling: We have discussed the mechanism of action of aromatase inhibitors today.  We have discussed adverse effects including but not limited to menopausal symptoms, increased risk of osteoporosis and fractures, cardiovascular events, arthralgias and myalgias.  We do believe that the benefits far outweigh the risks.  Plan treatment duration of 5 years.  Given her postmenopausal status, she can proceed with tamoxifen or aromatase inhibitors for adjuvant options.  We have also discussed about the DCIS blood collection study today and she agreed for the blood draw.  All questions were answered. The patient knows to call the clinic with any problems, questions or concerns.  We will plan to see her after completion of radiation unless there is any invasive competent noticed on the final pathology specimen.      Benay Pike, MD 04/29/21

## 2021-04-30 ENCOUNTER — Ambulatory Visit: Payer: Self-pay | Admitting: Surgery

## 2021-04-30 ENCOUNTER — Encounter: Payer: Self-pay | Admitting: Genetic Counselor

## 2021-04-30 DIAGNOSIS — D0512 Intraductal carcinoma in situ of left breast: Secondary | ICD-10-CM

## 2021-04-30 DIAGNOSIS — Z808 Family history of malignant neoplasm of other organs or systems: Secondary | ICD-10-CM | POA: Insufficient documentation

## 2021-04-30 NOTE — Progress Notes (Signed)
REFERRING PROVIDER: Benay Pike, MD [place], [place] [address]  PRIMARY PROVIDER:  [name], MD  PRIMARY REASON FOR VISIT:  1. Ductal carcinoma in situ (DCIS) of left breast   2. Family history of bone cancer     HISTORY OF PRESENT ILLNESS:   Ms. Matarazzo, a 51 y.o. female, was seen for a [place] cancer genetics consultation during the breast multidisciplinary clinic at the request of Dr. Chryl Heck due to a personal and family history of cancer.  Ms. Murphey presents to clinic today to discuss the possibility of a hereditary predisposition to cancer, to discuss genetic testing, and to further clarify her future cancer risks, as well as potential cancer risks for family members.   In January 2023, at the age of 76, Ms. Sumpter was diagnosed with ductal carcinoma in situ of the left breast. The treatment plan is pending.   CANCER HISTORY:  Oncology History   No history exists.    RISK FACTORS:  Menarche was at age 73.  First live birth at age 50.  OCP use for approximately 10 years.  Ovaries intact: yes.  Uterus intact: yes.  Menopausal status: postmenopausal.  HRT use: 0 years. Mammogram within the last year: yes.  Past Medical History:  Diagnosis Date   Breast cancer ([place])    Hyperlipidemia    Hypertension 03/29/2006   Lupus (systemic lupus erythematosus) ([facility])     Past Surgical History:  Procedure Laterality Date   BREAST BIOPSY Left 04/16/2021   US biopsy   BREAST BIOPSY Right 04/27/2021   US biopsy   cardiac window  01/27/1998   mastopexy and augmentation  06/27/2004    Social History   Socioeconomic History   Marital status: Divorced    Spouse name: Not on file   Number of children: Not on file   Years of education: Not on file   Highest education level: Not on file  Occupational History   Not on file  Tobacco Use   Smoking status: Never   Smokeless tobacco: Never  Substance and Sexual Activity   Alcohol  use: Yes    Comment: 1-2 a year   Drug use: No   Sexual activity: Not on file  Other Topics Concern   Not on file  Social History Narrative   Not on file   Social Determinants of Health   Financial Resource Strain: Low Risk    Difficulty of Paying Living Expenses: Not hard at all  Food Insecurity: No Food Insecurity   Worried About Charity fundraiser in the Last Year: Never true   Ran Out of Food in the Last Year: Never true  Transportation Needs: No Transportation Needs   Lack of Transportation (Medical): No   Lack of Transportation (Non-Medical): No  Physical Activity: Not on file  Stress: Not on file  Social Connections: Not on file     FAMILY HISTORY:  We obtained a detailed, 4-generation family history.  Significant diagnoses are listed below: Family History  Problem Relation Age of Onset   Hyperlipidemia Mother    Bone cancer Maternal Uncle    Bone cancer Maternal Grandfather    Hypertension Other      Ms. Risden's maternal uncle and maternal grandfather were diagnosed with bone cancer at unknown ages, they are deceased. Her maternal great aunt was diagnosed with an unknown cancer that had metastasized at diagnosis, she is deceased. Of note, Ms. Lampkins does not have any information about her paternal family  medical history. Ms. Shanafelt is unaware of previous family history of genetic testing for hereditary cancer risks. There is no reported Ashkenazi Jewish ancestry.   GENETIC COUNSELING ASSESSMENT: Ms. Mixon is a 51 y.o. female with a personal and family history of cancer which is somewhat suggestive of a hereditary cancer syndrome and predisposition to cancer. We, therefore, discussed and recommended the following at today's visit.   DISCUSSION: We discussed that 5 - 10% of cancer is hereditary, with most cases of hereditary breast cancer associated with mutations in BRCA1/2.  There are other genes that can be associated with hereditary breast cancer  syndromes. Type of cancer risk and level of risk are gene-specific. We discussed that testing is beneficial for several reasons including knowing how to follow individuals after completing their treatment, identifying whether potential treatment options would be beneficial, and understanding if other family members could be at risk for cancer and allowing them to undergo genetic testing.   We reviewed the characteristics, features and inheritance patterns of hereditary cancer syndromes. We also discussed genetic testing, including the appropriate family members to test, the process of testing, insurance coverage and turn-around-time for results. We discussed the implications of a negative, positive and/or variant of uncertain significant result. In order to get genetic test results in a timely manner so that Ms. Sica can use these genetic test results for surgical decisions, we recommended Ms. Lisa pursue genetic testing for the [facility] panel. Once complete, we recommend Ms. Meenan pursue reflex genetic testing to a more comprehensive gene panel.   Ms. Roen  was offered a common hereditary cancer panel (47 genes) and an expanded pan-cancer panel (77 genes). Ms. Stmarie was informed of the benefits and limitations of each panel, including that expanded pan-cancer panels contain genes that do not have clear management guidelines at this point in time.  We also discussed that as the number of genes included on a panel increases, the chances of variants of uncertain significance increases.  After considering the benefits and limitations of each gene panel, Ms. Levay elected to have [place].  The CancerNext-Expanded gene panel offered by [facility] and includes sequencing, rearrangement, and RNA analysis for the following 77 genes: AIP, ALK, APC, ATM, AXIN2, BAP1, BARD1, BLM, BMPR1A, BRCA1, BRCA2, BRIP1, CDC73, CDH1, CDK4, CDKN1B, CDKN2A, CHEK2, CTNNA1, DICER1, FANCC,  FH, FLCN, GALNT12, KIF1B, LZTR1, MAX, MEN1, MET, MLH1, MSH2, MSH3, MSH6, MUTYH, NBN, NF1, NF2, NTHL1, PALB2, PHOX2B, PMS2, POT1, PRKAR1A, PTCH1, PTEN, RAD51C, RAD51D, RB1, RECQL, RET, SDHA, SDHAF2, SDHB, SDHC, SDHD, SMAD4, SMARCA4, SMARCB1, SMARCE1, STK11, SUFU, TMEM127, TP53, TSC1, TSC2, VHL and XRCC2 (sequencing and deletion/duplication); EGFR, EGLN1, HOXB13, KIT, MITF, PDGFRA, POLD1, and POLE (sequencing only); EPCAM and GREM1 (deletion/duplication only).   Based on Ms. Rumer's personal and family history of cancer, she meets medical criteria for genetic testing based on our recommendation to test all women diagnosed with breast cancer under age 60. However, she does not quite meet NCCN criteria. She may have an out of pocket cost. We discussed that if her out of pocket cost for testing is over $100, the laboratory should contact them to discuss self-pay prices, patient pay assistance programs, if applicable, and other billing options.   PLAN: After considering the risks, benefits, and limitations, Ms. Koenigsberg provided informed consent to pursue genetic testing and the blood sample was sent to [facility] for analysis of the CancerNext-Expanded Panel. Results should be available within approximately 1-2 weeks' time, at which point they will be disclosed by  telephone to Ms. Jacko, as will any additional recommendations warranted by these results. Ms. Rising will receive a summary of her genetic counseling visit and a copy of her results once available. This information will also be available in Epic.   Ms. Keelin's questions were answered to her satisfaction today. Our contact information was provided should additional questions or concerns arise. Thank you for the referral and allowing [place] to share in the care of your patient.   [name], MS, [facility] Genetic Counselor [email or web address] (P) [phone]  The patient was seen for a total of 20 minutes in  face-to-face genetic counseling. The patient was seen alone.  Drs. Lindi Adie and/or Burr Medico were available to discuss this case as needed.  _______________________________________________________________________ For Office Staff:  Number of people involved in session: 1 Was an Intern/ student involved with case: no

## 2021-05-01 ENCOUNTER — Other Ambulatory Visit: Payer: Self-pay | Admitting: Surgery

## 2021-05-01 DIAGNOSIS — D0512 Intraductal carcinoma in situ of left breast: Secondary | ICD-10-CM

## 2021-05-07 ENCOUNTER — Telehealth: Payer: Self-pay | Admitting: *Deleted

## 2021-05-07 DIAGNOSIS — D0512 Intraductal carcinoma in situ of left breast: Secondary | ICD-10-CM

## 2021-05-07 NOTE — Telephone Encounter (Signed)
Spoke to pt concerning [place] From 04/29/21. Denies questions or concerns regarding dx or treatment care plan. Encourage pt to call with needs. Received verbal understanding.

## 2021-05-08 ENCOUNTER — Encounter: Payer: Self-pay | Admitting: *Deleted

## 2021-05-12 ENCOUNTER — Encounter (HOSPITAL_BASED_OUTPATIENT_CLINIC_OR_DEPARTMENT_OTHER): Payer: Self-pay | Admitting: Surgery

## 2021-05-12 ENCOUNTER — Other Ambulatory Visit: Payer: Self-pay

## 2021-05-15 ENCOUNTER — Telehealth: Payer: Self-pay | Admitting: Genetic Counselor

## 2021-05-15 ENCOUNTER — Encounter: Payer: Self-pay | Admitting: Genetic Counselor

## 2021-05-15 ENCOUNTER — Encounter (HOSPITAL_BASED_OUTPATIENT_CLINIC_OR_DEPARTMENT_OTHER)
Admission: RE | Admit: 2021-05-15 | Discharge: 2021-05-15 | Disposition: A | Discharge status: Home / Self Care | Payer: No Typology Code available for payment source | Source: Ambulatory Visit | Attending: Surgery | Admitting: Surgery

## 2021-05-15 DIAGNOSIS — Z01810 Encounter for preprocedural cardiovascular examination: Secondary | ICD-10-CM | POA: Diagnosis not present

## 2021-05-15 DIAGNOSIS — Z1379 Encounter for other screening for genetic and chromosomal anomalies: Secondary | ICD-10-CM | POA: Insufficient documentation

## 2021-05-15 NOTE — Progress Notes (Signed)

## 2021-05-15 NOTE — Telephone Encounter (Signed)
I attempted to contact Theresa Fisher to discuss her genetic testing results (77 genes). I left a voicemail requesting she call me back at [phone].  [name], MS, [facility] Genetic Counselor [name] (P) [phone]

## 2021-05-19 ENCOUNTER — Telehealth: Payer: Self-pay | Admitting: Genetic Counselor

## 2021-05-19 NOTE — Telephone Encounter (Addendum)
I contacted Ms. Eakes to discuss her genetic testing results. No pathogenic variants were identified in the 77 genes analyzed. Detailed clinic note to follow.  The test report has been scanned into EPIC and is located under the Molecular Pathology section of the Results Review tab.  A portion of the result report is included below for reference.   [name], MS, [facility] Genetic Counselor [email or web address] (P) [phone]

## 2021-05-20 ENCOUNTER — Ambulatory Visit
Admission: RE | Admit: 2021-05-20 | Discharge: 2021-05-20 | Disposition: A | Discharge status: Home / Self Care | Payer: No Typology Code available for payment source | Source: Ambulatory Visit | Attending: Surgery | Admitting: Surgery

## 2021-05-20 ENCOUNTER — Other Ambulatory Visit: Payer: Self-pay | Admitting: Surgery

## 2021-05-20 ENCOUNTER — Other Ambulatory Visit: Payer: Self-pay

## 2021-05-20 DIAGNOSIS — D0512 Intraductal carcinoma in situ of left breast: Secondary | ICD-10-CM

## 2021-05-21 ENCOUNTER — Other Ambulatory Visit: Payer: Self-pay

## 2021-05-21 ENCOUNTER — Encounter (HOSPITAL_BASED_OUTPATIENT_CLINIC_OR_DEPARTMENT_OTHER): Payer: Self-pay | Admitting: Surgery

## 2021-05-21 ENCOUNTER — Ambulatory Visit
Admission: RE | Admit: 2021-05-21 | Discharge: 2021-05-21 | Disposition: A | Discharge status: Home / Self Care | Payer: No Typology Code available for payment source | Source: Ambulatory Visit | Attending: Surgery | Admitting: Surgery

## 2021-05-21 ENCOUNTER — Encounter (HOSPITAL_BASED_OUTPATIENT_CLINIC_OR_DEPARTMENT_OTHER)
Admission: RE | Disposition: A | Discharge status: Home / Self Care | Payer: Self-pay | Source: Home / Self Care | Attending: Surgery

## 2021-05-21 ENCOUNTER — Ambulatory Visit (HOSPITAL_BASED_OUTPATIENT_CLINIC_OR_DEPARTMENT_OTHER): Payer: No Typology Code available for payment source | Admitting: Anesthesiology

## 2021-05-21 ENCOUNTER — Ambulatory Visit (HOSPITAL_BASED_OUTPATIENT_CLINIC_OR_DEPARTMENT_OTHER)
Admission: RE | Admit: 2021-05-21 | Discharge: 2021-05-21 | Disposition: A | Discharge status: Home / Self Care | Payer: No Typology Code available for payment source | Attending: Surgery | Admitting: Surgery

## 2021-05-21 DIAGNOSIS — D0512 Intraductal carcinoma in situ of left breast: Secondary | ICD-10-CM

## 2021-05-21 DIAGNOSIS — C50912 Malignant neoplasm of unspecified site of left female breast: Secondary | ICD-10-CM | POA: Insufficient documentation

## 2021-05-21 DIAGNOSIS — N189 Chronic kidney disease, unspecified: Secondary | ICD-10-CM | POA: Insufficient documentation

## 2021-05-21 DIAGNOSIS — I129 Hypertensive chronic kidney disease with stage 1 through stage 4 chronic kidney disease, or unspecified chronic kidney disease: Secondary | ICD-10-CM | POA: Diagnosis not present

## 2021-05-21 DIAGNOSIS — C50911 Malignant neoplasm of unspecified site of right female breast: Secondary | ICD-10-CM | POA: Diagnosis present

## 2021-05-21 DIAGNOSIS — Z170 Estrogen receptor positive status [ER+]: Secondary | ICD-10-CM | POA: Diagnosis not present

## 2021-05-21 HISTORY — PX: BREAST LUMPECTOMY WITH RADIOACTIVE SEED LOCALIZATION: SHX6424

## 2021-05-21 HISTORY — PX: BREAST LUMPECTOMY: SHX2

## 2021-05-21 SURGERY — BREAST LUMPECTOMY WITH RADIOACTIVE SEED LOCALIZATION
Anesthesia: General | Site: Breast | Laterality: Left

## 2021-05-21 MED ORDER — GABAPENTIN 300 MG PO CAPS
300.0000 mg | ORAL_CAPSULE | ORAL | Status: AC
  Administered 2021-05-21: 300 mg via ORAL

## 2021-05-21 MED ORDER — MIDAZOLAM HCL 5 MG/5ML IJ SOLN
INTRAMUSCULAR | Status: DC | PRN
  Administered 2021-05-21: 2 mg via INTRAVENOUS

## 2021-05-21 MED ORDER — HYDROMORPHONE HCL 1 MG/ML IJ SOLN: 0.2500 mg | INTRAMUSCULAR | Status: DC | PRN

## 2021-05-21 MED ORDER — LACTATED RINGERS IV SOLN: INTRAVENOUS | Status: DC

## 2021-05-21 MED ORDER — CLINDAMYCIN PHOSPHATE 900 MG/50ML IV SOLN
INTRAVENOUS | Status: AC
  Filled 2021-05-21: qty 50

## 2021-05-21 MED ORDER — FENTANYL CITRATE (PF) 100 MCG/2ML IJ SOLN
INTRAMUSCULAR | Status: DC | PRN
  Administered 2021-05-21: 50 ug via INTRAVENOUS

## 2021-05-21 MED ORDER — CHLORHEXIDINE GLUCONATE CLOTH 2 % EX PADS: 6.0000 | MEDICATED_PAD | Freq: Once | CUTANEOUS | Status: DC

## 2021-05-21 MED ORDER — LIDOCAINE HCL (CARDIAC) PF 100 MG/5ML IV SOSY
PREFILLED_SYRINGE | INTRAVENOUS | Status: DC | PRN
Start: 2021-05-21 — End: 2021-05-21
  Administered 2021-05-21: 60 mg via INTRAVENOUS

## 2021-05-21 MED ORDER — SODIUM CHLORIDE 0.9 % IV SOLN
INTRAVENOUS | Status: AC
  Filled 2021-05-21: qty 10

## 2021-05-21 MED ORDER — DEXAMETHASONE SODIUM PHOSPHATE 4 MG/ML IJ SOLN
INTRAMUSCULAR | Status: DC | PRN
  Administered 2021-05-21: 8 mg via INTRAVENOUS

## 2021-05-21 MED ORDER — ONDANSETRON HCL 4 MG/2ML IJ SOLN
INTRAMUSCULAR | Status: AC
  Filled 2021-05-21: qty 2

## 2021-05-21 MED ORDER — LIDOCAINE 2% (20 MG/ML) 5 ML SYRINGE
INTRAMUSCULAR | Status: AC
  Filled 2021-05-21: qty 5

## 2021-05-21 MED ORDER — ACETAMINOPHEN 500 MG PO TABS
1000.0000 mg | ORAL_TABLET | ORAL | Status: AC
  Administered 2021-05-21: 1000 mg via ORAL

## 2021-05-21 MED ORDER — OXYCODONE HCL 5 MG PO TABS: 5.0000 mg | ORAL_TABLET | Freq: Four times a day (QID) | ORAL | 0 refills | Status: AC | PRN

## 2021-05-21 MED ORDER — ONDANSETRON HCL 4 MG/2ML IJ SOLN
INTRAMUSCULAR | Status: DC | PRN
  Administered 2021-05-21: 4 mg via INTRAVENOUS

## 2021-05-21 MED ORDER — FENTANYL CITRATE (PF) 100 MCG/2ML IJ SOLN
INTRAMUSCULAR | Status: AC
Start: 2021-05-21 — End: ?
  Filled 2021-05-21: qty 2

## 2021-05-21 MED ORDER — EPHEDRINE 5 MG/ML INJ
INTRAVENOUS | Status: AC
  Filled 2021-05-21: qty 5

## 2021-05-21 MED ORDER — PROPOFOL 10 MG/ML IV BOLUS
INTRAVENOUS | Status: DC | PRN
  Administered 2021-05-21: 150 mg via INTRAVENOUS

## 2021-05-21 MED ORDER — EPHEDRINE SULFATE (PRESSORS) 50 MG/ML IJ SOLN
INTRAMUSCULAR | Status: DC | PRN
  Administered 2021-05-21: 20 mg via INTRAVENOUS

## 2021-05-21 MED ORDER — CLINDAMYCIN PHOSPHATE 900 MG/50ML IV SOLN
900.0000 mg | INTRAVENOUS | Status: AC
  Administered 2021-05-21: 900 mg via INTRAVENOUS

## 2021-05-21 MED ORDER — BUPIVACAINE-EPINEPHRINE (PF) 0.25% -1:200000 IJ SOLN
INTRAMUSCULAR | Status: DC | PRN
  Administered 2021-05-21: 10 mL

## 2021-05-21 MED ORDER — ACETAMINOPHEN 500 MG PO TABS
ORAL_TABLET | ORAL | Status: AC
  Filled 2021-05-21: qty 2

## 2021-05-21 MED ORDER — MIDAZOLAM HCL 2 MG/2ML IJ SOLN
INTRAMUSCULAR | Status: AC
  Filled 2021-05-21: qty 2

## 2021-05-21 MED ORDER — 0.9 % SODIUM CHLORIDE (POUR BTL) OPTIME
TOPICAL | Status: DC | PRN
  Administered 2021-05-21: 1000 mL

## 2021-05-21 MED ORDER — GABAPENTIN 300 MG PO CAPS
ORAL_CAPSULE | ORAL | Status: AC
  Filled 2021-05-21: qty 1

## 2021-05-21 SURGICAL SUPPLY — 51 items
ADH SKN CLS APL DERMABOND .7 (GAUZE/BANDAGES/DRESSINGS) ×1
APL PRP STRL LF DISP 70% ISPRP (MISCELLANEOUS) ×1
APPLIER CLIP 9.375 MED OPEN (MISCELLANEOUS)
APR CLP MED 9.3 20 MLT OPN (MISCELLANEOUS)
BINDER BREAST LRG (GAUZE/BANDAGES/DRESSINGS) IMPLANT
BINDER BREAST MEDIUM (GAUZE/BANDAGES/DRESSINGS) IMPLANT
BINDER BREAST XLRG (GAUZE/BANDAGES/DRESSINGS) IMPLANT
BINDER BREAST XXLRG (GAUZE/BANDAGES/DRESSINGS) IMPLANT
BLADE SURG 15 STRL LF DISP TIS (BLADE) ×1 IMPLANT
BLADE SURG 15 STRL SS (BLADE) ×2
CANISTER SUC SOCK COL 7IN (MISCELLANEOUS) IMPLANT
CANISTER SUCT 1200ML W/VALVE (MISCELLANEOUS) ×1 IMPLANT
CHLORAPREP W/TINT 26 (MISCELLANEOUS) ×2 IMPLANT
CLIP APPLIE 9.375 MED OPEN (MISCELLANEOUS) IMPLANT
COVER BACK TABLE 60X90IN (DRAPES) ×2 IMPLANT
COVER MAYO STAND STRL (DRAPES) ×2 IMPLANT
COVER PROBE W GEL 5X96 (DRAPES) ×2 IMPLANT
DERMABOND ADVANCED (GAUZE/BANDAGES/DRESSINGS) ×1
DERMABOND ADVANCED .7 DNX12 (GAUZE/BANDAGES/DRESSINGS) ×1 IMPLANT
DRAPE LAPAROSCOPIC ABDOMINAL (DRAPES) IMPLANT
DRAPE LAPAROTOMY 100X72 PEDS (DRAPES) ×2 IMPLANT
DRAPE UTILITY XL STRL (DRAPES) ×2 IMPLANT
ELECT COATED BLADE 2.86 ST (ELECTRODE) ×2 IMPLANT
ELECT REM PT RETURN 9FT ADLT (ELECTROSURGICAL) ×2
ELECTRODE REM PT RTRN 9FT ADLT (ELECTROSURGICAL) ×1 IMPLANT
GLOVE SRG 8 PF TXTR STRL LF DI (GLOVE) ×1 IMPLANT
GLOVE SURG LTX SZ8 (GLOVE) ×2 IMPLANT
GLOVE SURG UNDER POLY LF SZ8 (GLOVE) ×2
GOWN STRL REUS W/ TWL LRG LVL3 (GOWN DISPOSABLE) ×2 IMPLANT
GOWN STRL REUS W/ TWL XL LVL3 (GOWN DISPOSABLE) ×1 IMPLANT
GOWN STRL REUS W/TWL LRG LVL3 (GOWN DISPOSABLE) ×4
GOWN STRL REUS W/TWL XL LVL3 (GOWN DISPOSABLE) ×2
HEMOSTAT ARISTA ABSORB 3G PWDR (HEMOSTASIS) IMPLANT
HEMOSTAT SNOW SURGICEL 2X4 (HEMOSTASIS) IMPLANT
KIT MARKER MARGIN INK (KITS) ×2 IMPLANT
NDL HYPO 25X1 1.5 SAFETY (NEEDLE) ×1 IMPLANT
NEEDLE HYPO 25X1 1.5 SAFETY (NEEDLE) ×2 IMPLANT
NS IRRIG 1000ML POUR BTL (IV SOLUTION) ×3 IMPLANT
PACK BASIN DAY SURGERY FS (CUSTOM PROCEDURE TRAY) ×2 IMPLANT
PENCIL SMOKE EVACUATOR (MISCELLANEOUS) ×2 IMPLANT
SLEEVE SCD COMPRESS KNEE MED (STOCKING) ×2 IMPLANT
SPIKE FLUID TRANSFER (MISCELLANEOUS) IMPLANT
SPONGE T-LAP 4X18 ~~LOC~~+RFID (SPONGE) ×2 IMPLANT
SUT MNCRL AB 4-0 PS2 18 (SUTURE) ×2 IMPLANT
SUT SILK 2 0 SH (SUTURE) IMPLANT
SUT VICRYL 3-0 CR8 SH (SUTURE) ×2 IMPLANT
SYR CONTROL 10ML LL (SYRINGE) ×2 IMPLANT
TOWEL GREEN STERILE FF (TOWEL DISPOSABLE) ×2 IMPLANT
TRAY FAXITRON CT DISP (TRAY / TRAY PROCEDURE) ×2 IMPLANT
TUBE CONNECTING 20X1/4 (TUBING) ×1 IMPLANT
YANKAUER SUCT BULB TIP NO VENT (SUCTIONS) ×1 IMPLANT

## 2021-05-21 NOTE — H&P (Signed)
History of Present Illness: Theresa Fisher is a 51 y.o. female who is seen today as an office consultation at the request of Dr. Pasty Fisher for evaluation of Breast Cancer .   Patient is seen today in the multidisciplinary clinic for evaluation of recently diagnosed left breast DCIS. She was noted to have a 2 cm mass left central breast to left upper outer quadrant. Core biopsy showed DCIS. On the right she had a benign fibroadenoma. No previous breast biopsy history of family history of breast cancer. Denies any history of breast pain, nipple discharge or any change in the appearance of the breast. Implants have been in place since 2009. They are saline.  Review of Systems: A complete review of systems was obtained from the patient. I have reviewed this information and discussed as appropriate with the patient. See HPI as well for other ROS.  Review of Systems  Constitutional: Negative.  HENT: Negative.  Eyes: Negative.  Respiratory: Negative.  Cardiovascular: Negative.  Gastrointestinal: Negative.  Genitourinary: Negative.  Musculoskeletal: Negative.  Skin: Negative.  Neurological: Negative.  Endo/Heme/Allergies: Negative.  Psychiatric/Behavioral: Negative.    Medical History: Past Medical History:  Diagnosis Date   Chronic kidney disease   History of cancer   Hyperlipidemia   Hypertension   Patient Active Problem List  Diagnosis   Ductal carcinoma in situ (DCIS) of left breast   Essential (primary) hypertension   Hyperlipidemia   Past Surgical History:  Procedure Laterality Date   Breast Lift N/A  2006   Cardiac Window N/A  1999    Allergies  Allergen Reactions   Penciclovir Unknown   Penicillins Rash   Solbar Pf Spf15 [Sunscreen] Unknown   Current Outpatient Medications on File Prior to Visit  Medication Sig Dispense Refill   amLODIPine (NORVASC) 10 MG tablet Take 1 tablet by mouth once daily   atorvastatin (LIPITOR) 40 MG tablet Take 1 tablet by mouth once  daily   hydrOXYchloroQUINE (PLAQUENIL) 200 mg tablet Take 1 tablet by mouth once daily   No current facility-administered medications on file prior to visit.   Family History  Problem Relation Age of Onset   Hyperlipidemia (Elevated cholesterol) Mother    Social History   Tobacco Use  Smoking Status Never  Smokeless Tobacco Never    Social History   Socioeconomic History   Marital status: Unknown  Tobacco Use   Smoking status: Never   Smokeless tobacco: Never  Vaping Use   Vaping Use: Never used  Substance and Sexual Activity   Alcohol use: Yes  Comment: "social"   Drug use: Never   Objective:  There were no vitals filed for this visit.  There is no height or weight on file to calculate BMI.  Physical Exam Constitutional:  Appearance: Normal appearance.  HENT:  Head: Normocephalic.  Eyes:  General: No scleral icterus. Pupils: Pupils are equal, round, and reactive to light.  Cardiovascular:  Rate and Rhythm: Normal rate.  Pulmonary:  Effort: Pulmonary effort is normal.  Breath sounds: No stridor.  Chest:  Breasts: Right: Normal.  Left: Normal.  Comments: Bruising on the right noted. Band-Aid on the right noted. Bruising on left noted. Scars from previous reduction as well as lift noted with implants. Moderate size capsule bilaterally. Musculoskeletal:  Cervical back: Normal range of motion.  Lymphadenopathy:  Upper Body:  Right upper body: No supraclavicular or axillary adenopathy.  Left upper body: No supraclavicular or axillary adenopathy.  Neurological:  Mental Status: She is alert.  Labs, Imaging and Diagnostic Testing: CLINICAL DATA: Patient presents for follow-up of probably benign right breast mass.   EXAM: DIGITAL DIAGNOSTIC BILATERAL MAMMOGRAM WITH IMPLANTS, CAD AND TOMOSYNTHESIS; ULTRASOUND LEFT BREAST LIMITED; ULTRASOUND RIGHT BREAST LIMITED   TECHNIQUE: Bilateral digital diagnostic mammography and breast tomosynthesis was  performed. The images were evaluated with computer-aided detection. Standard and/or implant displaced views were performed.; Targeted ultrasound examination of the left breast was performed.; Targeted ultrasound examination of the right breast was performed   COMPARISON: Previous exam(s).   ACR Breast Density Category c: The breast tissue is heterogeneously dense, which may obscure small masses.   FINDINGS: There is a persistent oval circumscribed mass within the anterior right breast. Within the central posterior left breast there is a persistent lobular mass with punctate calcifications. No additional concerning findings in either breast. The patient has retropectoral implants.   Targeted ultrasound is performed, showing a 5 x 3 x 6 mm irregular hypoechoic mass left breast 11 o'clock position 4 cm from nipple. There are a few mildly enlarged left axillary lymph nodes measuring up to 5 mm.   Unchanged 5 x 5 x 4 mm oval circumscribed hypoechoic mass right breast 5:30 o'clock 2 cm from the nipple, previously 5 x 5 x 5 mm.   IMPRESSION: 1. New indeterminate left breast mass 11 o'clock position. 2. Mildly enlarged left axillary lymph nodes. 3. Stable probably benign right breast mass 5:30 o'clock.   RECOMMENDATION: 1. Ultrasound-guided core needle biopsy new left breast mass 11 o'clock position. 2. Ultrasound-guided core needle biopsy of 1 of the enlarged left axillary lymph nodes. While these may potentially be reactive from the history of lupus, patient desires to have biopsy at the same time as the breast mass for definitive characterization. 3. If this demonstrates benign pathology, recommend 1 additional follow-up in 1 year of the right breast mass.   I have discussed the findings and recommendations with the patient. If applicable, a reminder letter will be sent to the patient regarding the next appointment.   BI-RADS CATEGORY 4: Suspicious.     Electronically  Signed By: Theresa Fisher M.D. On: 04/09/2021 09:50  ADDITIONAL INFORMATION: 1. PROGNOSTIC INDICATORS Results: IMMUNOHISTOCHEMICAL AND MORPHOMETRIC ANALYSIS PERFORMED MANUALLY Estrogen Receptor: 100%, POSITIVE, STRONG STAINING INTENSITY Progesterone Receptor: 20%, POSITIVE, MODERATE STAINING INTENSITY REFERENCE RANGE ESTROGEN RECEPTOR NEGATIVE 0% POSITIVE =>1% REFERENCE RANGE PROGESTERONE RECEPTOR NEGATIVE 0% POSITIVE =>1% All controls stained appropriately Thressa Sheller MD Pathologist, Electronic Signature ( Signed 04/21/2021) FINAL DIAGNOSIS Diagnosis 1. Breast, left, needle core biopsy, 11:44, 4cmfn, ribbon clip - DUCTAL CARCINOMA IN SITU, GRADE 2 TO 3. - NO INVASIVE CARCINOMA IDENTIFIED. - SEE COMMENT. 2. Lymph node, needle/core biopsy, left axillary, tribell clip - BENIGN LYMPH NODE. NO METASTATIC CARCINOMA IDENTIFIED. 1 of 2 FINAL for Backlund, Lashonne A ([id number]) Microscopic Comment 1. Dr. Alric Seton concurs with the diagnoses. Material will be taken for receptor studies and the results will be reported in an addendum. The results were conveyed to [facility] on 11:45 a.m. on 04/17/21) Mark Martinique MD Pathologist, Electronic Signature (Case signed 04/17/2021)  Assessment and Plan:   Diagnoses and all orders for this visit:  Ductal carcinoma in situ (DCIS) of left breast    Discussion today in the multidisciplinary clinic. Discussed options of breast conserving surgery versus mastectomy with reconstruction. Discussed the pros and cons of each approach, local regional recurrence of each approach, long-term quality of life issues, and additional therapies. Discussed that lymph nodes were not routinely  mapped this procedure. Patient can be used for delayed mapping. Risks and benefits of surgery discussed. Discussed reexcision with lumpectomy. She would like to proceed with left breast seed localized lumpectomy. Discussed risk of  bleeding, infection, reexcision, cosmetic deformity, injury to her implants, implant removal, the need for the treatments and/or procedures and long-term expectation of treatment as well as local regional recurrence and the risk of developing a new breast cancer and/or new DCIS versus recurrent versus new breast cancer. She would like to proceed with breast conserving surgery and I discussed with her left breast seed localized lumpectomy.  No follow-ups on file.

## 2021-05-21 NOTE — Anesthesia Preprocedure Evaluation (Addendum)
Anesthesia Evaluation  Patient identified by MRN, date of birth, ID band Patient awake    Reviewed: Allergy & Precautions, H&P , NPO status , Patient's Chart, lab work & pertinent test results  Airway Mallampati: II  TM Distance: >3 FB Neck ROM: Full    Dental no notable dental hx. (+) Teeth Intact, Dental Advisory Given   Pulmonary neg pulmonary ROS,    Pulmonary exam normal breath sounds clear to auscultation       Cardiovascular hypertension, Pt. on medications negative cardio ROS   Rhythm:Regular Rate:Normal     Neuro/Psych negative neurological ROS  negative psych ROS   GI/Hepatic negative GI ROS, Neg liver ROS,   Endo/Other  negative endocrine ROS  Renal/GU negative Renal ROS  negative genitourinary   Musculoskeletal   Abdominal   Peds  Hematology negative hematology ROS (+)   Anesthesia Other Findings   Reproductive/Obstetrics negative OB ROS                            Anesthesia Physical Anesthesia Plan  ASA: 2  Anesthesia Plan: General   Post-op Pain Management: Tylenol PO (pre-op)* and Gabapentin PO (pre-op)*   Induction: Intravenous  PONV Risk Score and Plan: 4 or greater and Ondansetron, Dexamethasone and Midazolam  Airway Management Planned: LMA  Additional Equipment:   Intra-op Plan:   Post-operative Plan: Extubation in OR  Informed Consent: I have reviewed the patients History and Physical, chart, labs and discussed the procedure including the risks, benefits and alternatives for the proposed anesthesia with the patient or authorized representative who has indicated his/her understanding and acceptance.     Dental advisory given  Plan Discussed with: CRNA  Anesthesia Plan Comments:        Anesthesia Quick Evaluation

## 2021-05-21 NOTE — Interval H&P Note (Signed)
History and Physical Interval Note:  05/21/2021 12:54 PM  Theresa Fisher  has presented today for surgery, with the diagnosis of LEFT BREAST DCIS.  The various methods of treatment have been discussed with the patient and family. After consideration of risks, benefits and other options for treatment, the patient has consented to  Procedure(s): LEFT BREAST LUMPECTOMY WITH RADIOACTIVE SEED LOCALIZATION (Left) as a surgical intervention.  The patient's history has been reviewed, patient examined, no change in status, stable for surgery.  I have reviewed the patient's chart and labs.  Questions were answered to the patient's satisfaction.     [place]

## 2021-05-21 NOTE — Anesthesia Postprocedure Evaluation (Signed)
Anesthesia Post Note  Patient: Etheleen Mayhew  Procedure(s) Performed: LEFT BREAST LUMPECTOMY WITH RADIOACTIVE SEED LOCALIZATION (Left: Breast)     Patient location during evaluation: PACU Anesthesia Type: General Level of consciousness: awake and alert Pain management: pain level controlled Vital Signs Assessment: post-procedure vital signs reviewed and stable Respiratory status: spontaneous breathing, nonlabored ventilation and respiratory function stable Cardiovascular status: blood pressure returned to baseline and stable Postop Assessment: no apparent nausea or vomiting Anesthetic complications: no   No notable events documented.  Last Vitals:  Vitals:   05/21/21 1415 05/21/21 1441  BP: (!) 105/57 (!) 113/59  Pulse: 79 77  Resp: 17 16  Temp:  37.1 C  SpO2: 100% 100%    Last Pain:  Vitals:   05/21/21 1424  TempSrc:   PainSc: 0-No pain                 Hamda Klutts,W. EDMOND

## 2021-05-21 NOTE — Discharge Instructions (Addendum)
[facility] Office Phone Number [phone]  BREAST BIOPSY/ PARTIAL MASTECTOMY: POST OP INSTRUCTIONS  Always review your discharge instruction sheet given to you by the facility where your surgery was performed.  IF YOU HAVE DISABILITY OR FAMILY LEAVE FORMS, YOU MUST BRING THEM TO THE OFFICE FOR PROCESSING.  DO NOT GIVE THEM TO YOUR DOCTOR.  A prescription for pain medication may be given to you upon discharge.  Take your pain medication as prescribed, if needed.  If narcotic pain medicine is not needed, then you may take acetaminophen (Tylenol) or ibuprofen (Advil) as needed. Take your usually prescribed medications unless otherwise directed If you need a refill on your pain medication, please contact your pharmacy.  They will contact our office to request authorization.  Prescriptions will not be filled after 5pm or on week-ends. You should eat very light the first 24 hours after surgery, such as soup, crackers, pudding, etc.  Resume your normal diet the day after surgery. Most patients will experience some swelling and bruising in the breast.  Ice packs and a good support bra will help.  Swelling and bruising can take several days to resolve.  It is common to experience some constipation if taking pain medication after surgery.  Increasing fluid intake and taking a stool softener will usually help or prevent this problem from occurring.  A mild laxative (Milk of Magnesia or Miralax) should be taken according to package directions if there are no bowel movements after 48 hours. Unless discharge instructions indicate otherwise, you may remove your bandages 24-48 hours after surgery, and you may shower at that time.  You may have steri-strips (small skin tapes) in place directly over the incision.  These strips should be left on the skin for 7-10 days.  If your surgeon used skin glue on the incision, you may shower in 24 hours.  The glue will flake off over the next 2-3 weeks.  Any  sutures or staples will be removed at the office during your follow-up visit. ACTIVITIES:  You may resume regular daily activities (gradually increasing) beginning the next day.  Wearing a good support bra or sports bra minimizes pain and swelling.  You may have sexual intercourse when it is comfortable. You may drive when you no longer are taking prescription pain medication, you can comfortably wear a seatbelt, and you can safely maneuver your car and apply brakes. RETURN TO WORK:  ______________________________________________________________________________________ You should see your doctor in the office for a follow-up appointment approximately two weeks after your surgery.  Your doctor's nurse will typically make your follow-up appointment when she calls you with your pathology report.  Expect your pathology report 2-3 business days after your surgery.  You may call to check if you do not hear from us after three days. OTHER INSTRUCTIONS: _______________________________________________________________________________________________ _____________________________________________________________________________________________________________________________________ _____________________________________________________________________________________________________________________________________ _____________________________________________________________________________________________________________________________________  WHEN TO CALL YOUR DOCTOR: Fever over 101.0 Nausea and/or vomiting. Extreme swelling or bruising. Continued bleeding from incision. Increased pain, redness, or drainage from the incision.  The clinic staff is available to answer your questions during regular business hours.  Please don't hesitate to call and ask to speak to one of the nurses for clinical concerns.  If you have a medical emergency, go to the nearest emergency room or call 911.  A surgeon from [facility] is always on call at the hospital.  For further questions, please visit [email or web address]    Post Anesthesia Home Care Instructions  Activity: Get plenty of rest for the remainder of   the day. A responsible individual must stay with you for 24 hours following the procedure.  For the next 24 hours, DO NOT: -Drive a car -Operate machinery -Drink alcoholic beverages -Take any medication unless instructed by your physician -Make any legal decisions or sign important papers.  Meals: Start with liquid foods such as gelatin or soup. Progress to regular foods as tolerated. Avoid greasy, spicy, heavy foods. If nausea and/or vomiting occur, drink only clear liquids until the nausea and/or vomiting subsides. Call your physician if vomiting continues.  Special Instructions/Symptoms: Your throat may feel dry or sore from the anesthesia or the breathing tube placed in your throat during surgery. If this causes discomfort, gargle with warm salt water. The discomfort should disappear within 24 hours.  If you had a scopolamine patch placed behind your ear for the management of post- operative nausea and/or vomiting:  1. The medication in the patch is effective for 72 hours, after which it should be removed.  Wrap patch in a tissue and discard in the trash. Wash hands thoroughly with soap and water. 2. You may remove the patch earlier than 72 hours if you experience unpleasant side effects which may include dry mouth, dizziness or visual disturbances. 3. Avoid touching the patch. Wash your hands with soap and water after contact with the patch.      

## 2021-05-21 NOTE — Transfer of Care (Signed)
Immediate Anesthesia Transfer of Care Note  Patient: Theresa Fisher  Procedure(s) Performed: LEFT BREAST LUMPECTOMY WITH RADIOACTIVE SEED LOCALIZATION (Left: Breast)  Patient Location: PACU  Anesthesia Type:General  Level of Consciousness: sedated  Airway & Oxygen Therapy: Patient Spontanous Breathing and Patient connected to face mask oxygen  Post-op Assessment: Report given to RN and Post -op Vital signs reviewed and stable  Post vital signs: Reviewed and stable  Last Vitals:  Vitals Value Taken Time  BP    Temp    Pulse 80 05/21/21 1351  Resp 15 05/21/21 1351  SpO2 99 % 05/21/21 1351  Vitals shown include unvalidated device data.  Last Pain:  Vitals:   05/21/21 1110  TempSrc: Oral  PainSc: 0-No pain      Patients Stated Pain Goal: 3 (99/14/44 5848)  Complications: No notable events documented.

## 2021-05-21 NOTE — Op Note (Signed)
Preoperative diagnosis: Left breast DCIS upper outer quadrant    postoperative diagnosis: Left breast DCIS overlapping sites  Procedure: Left breast seed localized lumpectomy  Surgeon: Erroll Luna, MD  Anesthesia: LMA with 0.25% Marcaine plain  EBL: Minimal  Specimen: Left breast tissue with seed and clip verified by Faxitron  Drains: None  Indications for procedure: The patient is a 51 year old female who was diagnosed with left breast DCIS.  She had previous breast reduction with augmentation.  She presents today for lumpectomy for breast conserving surgery.  Risks and benefits of surgery discussed.  Risk of bleeding, infection, injury to underlying tissues and/or structures, revisional surgery, loss of breast, poor wound healing, cosmetic deformity and need for further treatments.  She understood the above agreed to proceed.  Description of procedure: Patient was met in the holding area.  Left breast was marked as correct site.  Questions were answered.  She was taken back to The room.  She is placed supine upon the OR table.  After induction of general esthesia, left breast was prepped and draped in a sterile fashion and timeout performed.  Proper patient, site and procedure verified.  Neoprobe was used to identify the seed left breast upper central upper outer quadrant.  This appeared to be overlapping sites.  Incision was made 4 cm from the nipple.  Dissection was carried down all tissue and the seed and clip were excised.  Margins were grossly negative.  Underlying structures were preserved.  Hemostasis achieved with cautery.  Local anesthetic infiltrated carefully.  No evidence of any damage to the underlying structures.  Cavity closed with 3-0 Vicryl.  4 Monocryl was used to close the skin in a subcuticular fashion.  Dermabond applied.  All counts found to be correct.  Breast binder placed.  Patient awoke extubated taken to recovery in satisfactory condition.

## 2021-05-21 NOTE — Anesthesia Procedure Notes (Signed)
Procedure Name: LMA Insertion Date/Time: 05/21/2021 1:34 PM Performed by: Tawni Millers, CRNA Pre-anesthesia Checklist: Patient identified, Emergency Drugs available, Suction available and Patient being monitored Patient Re-evaluated:Patient Re-evaluated prior to induction Oxygen Delivery Method: Circle system utilized Preoxygenation: Pre-oxygenation with 100% oxygen Induction Type: IV induction Ventilation: Mask ventilation without difficulty LMA: LMA inserted LMA Size: 3.0 Number of attempts: 1 Airway Equipment and Method: Bite block Placement Confirmation: positive ETCO2 Tube secured with: Tape Dental Injury: Teeth and Oropharynx as per pre-operative assessment

## 2021-05-22 ENCOUNTER — Encounter (HOSPITAL_BASED_OUTPATIENT_CLINIC_OR_DEPARTMENT_OTHER): Payer: Self-pay | Admitting: Surgery

## 2021-05-25 ENCOUNTER — Ambulatory Visit: Payer: Self-pay | Admitting: Genetic Counselor

## 2021-05-25 DIAGNOSIS — Z1379 Encounter for other screening for genetic and chromosomal anomalies: Secondary | ICD-10-CM

## 2021-05-25 NOTE — Progress Notes (Signed)
HPI:   Theresa Fisher was previously seen in the [facility] clinic due to a personal and family history of cancer and concerns regarding a hereditary predisposition to cancer. Please refer to our prior cancer genetics clinic note for more information regarding our discussion, assessment and recommendations, at the time. Theresa Fisher recent genetic test results were disclosed to her, as were recommendations warranted by these results. These results and recommendations are discussed in more detail below.  CANCER HISTORY:  Oncology History  Ductal carcinoma in situ (DCIS) of left breast  04/27/2021 Initial Diagnosis   Ductal carcinoma in situ (DCIS) of left breast    Genetic Testing   [facility] CancerNext-Expanded Panel was Negative. Report date is 05/11/2021.  The CancerNext-Expanded gene panel offered by [facility] and includes sequencing, rearrangement, and RNA analysis for the following 77 genes: AIP, ALK, APC, ATM, AXIN2, BAP1, BARD1, BLM, BMPR1A, BRCA1, BRCA2, BRIP1, CDC73, CDH1, CDK4, CDKN1B, CDKN2A, CHEK2, CTNNA1, DICER1, FANCC, FH, FLCN, GALNT12, KIF1B, LZTR1, MAX, MEN1, MET, MLH1, MSH2, MSH3, MSH6, MUTYH, NBN, NF1, NF2, NTHL1, PALB2, PHOX2B, PMS2, POT1, PRKAR1A, PTCH1, PTEN, RAD51C, RAD51D, RB1, RECQL, RET, SDHA, SDHAF2, SDHB, SDHC, SDHD, SMAD4, SMARCA4, SMARCB1, SMARCE1, STK11, SUFU, TMEM127, TP53, TSC1, TSC2, VHL and XRCC2 (sequencing and deletion/duplication); EGFR, EGLN1, HOXB13, KIT, MITF, PDGFRA, POLD1, and POLE (sequencing only); EPCAM and GREM1 (deletion/duplication only).      FAMILY HISTORY:  We obtained a detailed, 4-generation family history.  Significant diagnoses are listed below:      Family History  Problem Relation Age of Onset   Hyperlipidemia Mother     Bone cancer Maternal Uncle     Bone cancer Maternal Grandfather     Hypertension Other         Ms. Lacey's maternal uncle and maternal grandfather were diagnosed with bone cancer at unknown  ages, they are deceased. Her maternal great aunt was diagnosed with an unknown cancer that had metastasized at diagnosis, she is deceased. Of note, Theresa Fisher does not have any information about her paternal family medical history. Theresa Fisher is unaware of previous family history of genetic testing for hereditary cancer risks. There is no reported Ashkenazi Jewish ancestry.   GENETIC TEST RESULTS:  The [facility] CancerNext-Expanded Panel found no pathogenic mutations.   The CancerNext-Expanded gene panel offered by [facility] and includes sequencing, rearrangement, and RNA analysis for the following 77 genes: AIP, ALK, APC, ATM, AXIN2, BAP1, BARD1, BLM, BMPR1A, BRCA1, BRCA2, BRIP1, CDC73, CDH1, CDK4, CDKN1B, CDKN2A, CHEK2, CTNNA1, DICER1, FANCC, FH, FLCN, GALNT12, KIF1B, LZTR1, MAX, MEN1, MET, MLH1, MSH2, MSH3, MSH6, MUTYH, NBN, NF1, NF2, NTHL1, PALB2, PHOX2B, PMS2, POT1, PRKAR1A, PTCH1, PTEN, RAD51C, RAD51D, RB1, RECQL, RET, SDHA, SDHAF2, SDHB, SDHC, SDHD, SMAD4, SMARCA4, SMARCB1, SMARCE1, STK11, SUFU, TMEM127, TP53, TSC1, TSC2, VHL and XRCC2 (sequencing and deletion/duplication); EGFR, EGLN1, HOXB13, KIT, MITF, PDGFRA, POLD1, and POLE (sequencing only); EPCAM and GREM1 (deletion/duplication only).   The test report has been scanned into EPIC and is located under the Molecular Pathology section of the Results Review tab.  A portion of the result report is included below for reference. Genetic testing reported out on 05/11/2021.        Even though a pathogenic variant was not identified, possible explanations for her personal history of cancer may include: There may be no hereditary risk for cancer in the family. The cancers in Theresa Fisher and/or her family may be due to other genetic or environmental factors. There may be a gene mutation in one  of these genes that current testing methods cannot detect, but that chance is small. There could be another gene that has not yet been discovered, or  that we have not yet tested, that is responsible for the cancer diagnoses in the family.   Therefore, it is important to remain in touch with cancer genetics in the future so that we can continue to offer Theresa Fisher the most up to date genetic testing.   ADDITIONAL GENETIC TESTING:  We discussed with Theresa Fisher that her genetic testing was fairly extensive.  If there are genes identified to increase cancer risk that can be analyzed in the future, we would be happy to discuss and coordinate this testing at that time.    CANCER SCREENING RECOMMENDATIONS:  Theresa Fisher's test result is considered negative (normal).  This means that we have not identified a hereditary cause for her personal and family history of cancer at this time. Most cancers happen by chance and this negative test suggests that her cancer may fall into this category.    An individual's cancer risk and medical management are not determined by genetic test results alone. Overall cancer risk assessment incorporates additional factors, including personal medical history, family history, and any available genetic information that may result in a personalized plan for cancer prevention and surveillance. Therefore, it is recommended she continue to follow the cancer management and screening guidelines provided by her oncology and primary healthcare provider.  RECOMMENDATIONS FOR FAMILY MEMBERS:   Since she did not inherit a mutation in a cancer predisposition gene included on this panel, her daughter could not have inherited a mutation from her in one of these genes. Individuals in this family might be at some increased risk of developing cancer, over the general population risk, due to the family history of cancer. We recommend women in this family have a yearly mammogram beginning at age 64, or 74 years younger than the earliest onset of cancer, an annual clinical breast exam, and perform monthly breast self-exams.   FOLLOW-UP:   Cancer genetics is a rapidly advancing field and it is possible that new genetic tests will be appropriate for her and/or her family members in the future. We encouraged her to remain in contact with cancer genetics on an annual basis so we can update her personal and family histories and let her know of advances in cancer genetics that may benefit this family.   Our contact number was provided. Ms. Gutzwiller's questions were answered to her satisfaction, and she knows she is welcome to call us at anytime with additional questions or concerns.   [name], MS, [facility] Genetic Counselor [email or web address] (P) [phone]

## 2021-05-27 LAB — SURGICAL PATHOLOGY

## 2021-05-28 ENCOUNTER — Encounter: Payer: Self-pay | Admitting: Surgery

## 2021-05-28 ENCOUNTER — Encounter: Payer: Self-pay | Admitting: *Deleted

## 2021-06-01 ENCOUNTER — Telehealth: Payer: Self-pay | Admitting: Hematology and Oncology

## 2021-06-01 NOTE — Telephone Encounter (Signed)
Scheduled appointment per 03/03 staff message. Left message with details.  ?

## 2021-06-11 ENCOUNTER — Encounter (HOSPITAL_COMMUNITY): Payer: Self-pay

## 2021-06-15 NOTE — Progress Notes (Addendum)
?Radiation Oncology         [phone] ?________________________________ ? ?Name: Theresa Fisher        MRN: [id number]  ?Date of Service: 06/18/2021 DOB: 1970-10-05 ? ?OH:[id number], Glendale Chard, MD  Benay Pike, MD    ? ?REFERRING PHYSICIAN: Benay Pike, MD ? ? ?DIAGNOSIS: The encounter diagnosis was Ductal carcinoma in situ (DCIS) of left breast. ? ? ?HISTORY OF PRESENT ILLNESS: Theresa Fisher is a 51 y.o. female seen in the multidisciplinary breast clinic for a new diagnosis of left breast cancer. The patient was noted to have an abnormality in the right breast that was biopsied and benign and during her follow-up diagnostic mammogram a mass was seen in the left breast.  By additional ultrasound imaging this was noted in the 11 o'clock position and measured up to 6 mm.  She did have mild nodal enlargement in the left axilla and subsequently underwent just double check that Biopsies on 04/16/2021 that showed a grade 2-3 ductal carcinoma in situ with no evidence of invasive disease.  Her left axillary lymph node was benign.  Her tumor was ER/PR positive.  An additional lesion in the right breast also underwent biopsy on 04/27/2021 that was benign.   ? ? ?Since her last visit, she has undergone left lumpectomy on 05/21/2021 which showed a intermediate grade DCIS measuring 1 cm in greatest dimension, her margins were negative but the closest was in the superior margin at 2 mm.  She is seen to discuss adjuvant radiotherapy. ? ? ?PREVIOUS RADIATION THERAPY: No ? ? ?PAST MEDICAL HISTORY:  ?Past Medical History:  ?Diagnosis Date  ? Breast cancer ([place])   ? Hyperlipidemia   ? Hypertension 03/29/2006  ? Lupus (systemic lupus erythematosus) ([place])   ?   ? ? ?PAST SURGICAL HISTORY: ?Past Surgical History:  ?Procedure Laterality Date  ? BREAST BIOPSY Left 04/16/2021  ? US biopsy  ? BREAST BIOPSY Right 04/27/2021  ? US biopsy  ? BREAST LUMPECTOMY WITH RADIOACTIVE SEED LOCALIZATION Left 05/21/2021  ? Procedure:  LEFT BREAST LUMPECTOMY WITH RADIOACTIVE SEED LOCALIZATION;  Surgeon: Erroll Luna, MD;  Location: [facility];  Service: General;  Laterality: Left;  ? cardiac window  01/27/1998  ? mastopexy and augmentation  06/27/2004  ? ? ? ?FAMILY HISTORY:  ?Family History  ?Problem Relation Age of Onset  ? Hyperlipidemia Mother   ? Bone cancer Maternal Uncle   ? Bone cancer Maternal Grandfather   ? Hypertension Other   ? ? ? ?SOCIAL HISTORY:  reports that she has never smoked. She has never used smokeless tobacco. She reports current alcohol use. She reports that she does not use drugs.  The patient is divorced and lives in [place].  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 She has an adult daughter who is a Paediatric nurse in [place], [place].  ? ? ?ALLERGIES: Penciclovir, Penicillins, and Solbar pf spf15 ? ? ?MEDICATIONS:  ?Current Outpatient Medications  ?Medication Sig Dispense Refill  ? amLODipine (NORVASC) 5 MG tablet TAKE 1 TABLET (5 MG TOTAL) BY MOUTH DAILY. (Patient taking differently: 10 mg.) 90 tablet 2  ? atorvastatin (LIPITOR) 40 MG tablet Take 1 tablet (40 mg total) by mouth daily. 90 tablet 2  ? hydrochlorothiazide (MICROZIDE) 12.5 MG capsule Take 1 capsule (12.5 mg total) by mouth daily. Take 1 capsule (12.5 total) by mouth daily - OFFICE APPOINTMENT NECESSARY FOR REFILLS (Patient taking differently: Take 25 mg by mouth daily. Take 1  capsule (12.5 total) by mouth daily - OFFICE APPOINTMENT NECESSARY FOR REFILLS) 30 capsule 0  ? hydroxychloroquine (PLAQUENIL) 200 MG tablet Take 1 tablet (200 mg total) by mouth 2 (two) times daily.    ? lisinopril (ZESTRIL) 30 MG tablet Take 30 mg by mouth daily.    ? Magnesium 125 MG CAPS Take by mouth.    ? minoxidil (LONITEN) 2.5 MG tablet Take by mouth daily.    ? Omega-3 Fatty Acids (OMEGA-3 FISH OIL PO) Take by mouth.    ? oxyCODONE (OXY IR/ROXICODONE) 5 MG immediate release tablet Take 1 tablet (5 mg total) by mouth  every 6 (six) hours as needed for severe pain. 15 tablet 0  ? Probiotic Product (PROBIOTIC-10) CHEW Chew by mouth.    ? ?No current facility-administered medications for this encounter.  ? ? ? ?REVIEW OF SYSTEMS: On review of systems, the patient reports that she is doing well overall. She denies any specific symptoms pertaining to the breast, and feels like she's healing nicely. No complaints are verbalized.  ? ?PHYSICAL EXAM:  ?Wt Readings from Last 3 Encounters:  ?06/18/21 132 lb (59.9 kg)  ?05/21/21 130 lb 15.3 oz (59.4 kg)  ?04/29/21 132 lb 8 oz (60.1 kg)  ? ?Temp Readings from Last 3 Encounters:  ?06/18/21 (!) 96.8 ?F (36 ?C) (Temporal)  ?05/21/21 98.7 ?F (37.1 ?C)  ?04/29/21 97.7 ?F (36.5 ?C) (Temporal)  ? ?BP Readings from Last 3 Encounters:  ?06/18/21 127/69  ?05/21/21 (!) 113/59  ?04/29/21 (!) 144/63  ? ?Pulse Readings from Last 3 Encounters:  ?06/18/21 93  ?05/21/21 77  ?04/29/21 (!) 101  ? ? ?In general this is a well appearing African-American female in no acute distress. She's alert and oriented x4 and appropriate throughout the examination. Cardiopulmonary assessment is negative for acute distress and she exhibits normal effort.  Her left breast incision is intact without erythema separation or drainage. Surgical glue is intact and still over the incision line. ? ? ?ECOG = 0 ? ?0 - Asymptomatic (Fully active, able to carry on all predisease activities without restriction) ? ?1 - Symptomatic but completely ambulatory (Restricted in physically strenuous activity but ambulatory and able to carry out work of a light or sedentary nature. For example, light housework, office work) ? ?2 - Symptomatic, <50% in bed during the day (Ambulatory and capable of all self care but unable to carry out any work activities. Up and about more than 50% of waking hours) ? ?3 - Symptomatic, >50% in bed, but not bedbound (Capable of only limited self-care, confined to bed or chair 50% or more of waking hours) ? ?4 - Bedbound  (Completely disabled. Cannot carry on any self-care. Totally confined to bed or chair) ? ?5 - Death ? ? Oken MM, Creech RH, Tormey DC, et al. [phone]). "Toxicity and response criteria of the [facility]". [facility] Oncol. 5 (6): 649-55 ? ? ? ?LABORATORY DATA:  ?Lab Results  ?Component Value Date  ? WBC 6.2 04/29/2021  ? HGB 13.1 04/29/2021  ? HCT 37.6 04/29/2021  ? MCV 78.7 (L) 04/29/2021  ? PLT 245 04/29/2021  ? ?Lab Results  ?Component Value Date  ? NA 139 04/29/2021  ? K 4.2 04/29/2021  ? CL 103 04/29/2021  ? CO2 30 04/29/2021  ? ?Lab Results  ?Component Value Date  ? ALT 20 04/29/2021  ? AST 20 04/29/2021  ? ALKPHOS 75 04/29/2021  ? BILITOT 0.4 04/29/2021  ? ?  ? ?RADIOGRAPHY:  MM Breast Surgical Specimen ? ?Result Date: 05/21/2021 ?CLINICAL DATA:  Evaluate specimen EXAM: SPECIMEN RADIOGRAPH OF THE LEFT BREAST COMPARISON:  Previous exam(s). FINDINGS: Status post excision of the left breast. The radioactive seed and biopsy marker clip are present, completely intact, and were marked for pathology. IMPRESSION: Specimen radiograph of the left breast. Electronically Signed   By: Dorise Bullion III M.D.   On: 05/21/2021 13:39 ? ?[place] LT RADIOACTIVE SEED LOC ? ?Result Date: 05/20/2021 ?CLINICAL DATA:  Pre lumpectomy localization DCIS in the 11 o'clock position of the left breast, marked with a ribbon shaped biopsy marker clip. EXAM: ULTRASOUND GUIDED RADIOACTIVE SEED LOCALIZATION OF THE LEFT BREAST COMPARISON:  Previous exam(s). FINDINGS: Patient presents for radioactive seed localization prior to left lumpectomy. I met with the patient and we discussed the procedure of seed localization including benefits and alternatives. We discussed the high likelihood of a successful procedure. We discussed the risks of the procedure including infection, bleeding, tissue injury and further surgery. We discussed the low dose of radioactivity involved in the procedure. Informed, written consent was given. The  usual time-out protocol was performed immediately prior to the procedure. Using ultrasound guidance, sterile technique, 1% lidocaine and an I-125 radioactive seed, a small post biopsy hematoma in the 11 o'clo

## 2021-06-17 ENCOUNTER — Telehealth: Payer: Self-pay

## 2021-06-17 NOTE — Telephone Encounter (Signed)
Verified patient identity and reminded patient of their 2:00pm-06/18/21 in-person appointment w/ Shona Simpson PA-C. I advised patient to arrive 41mn early for check-in. I left my extension [phone] case patient needs anything. ?

## 2021-06-18 ENCOUNTER — Ambulatory Visit
Admission: RE | Admit: 2021-06-18 | Discharge: 2021-06-18 | Disposition: A | Discharge status: Home / Self Care | Payer: No Typology Code available for payment source | Source: Ambulatory Visit | Attending: Radiation Oncology | Admitting: Radiation Oncology

## 2021-06-18 ENCOUNTER — Encounter: Payer: Self-pay | Admitting: Radiation Oncology

## 2021-06-18 ENCOUNTER — Other Ambulatory Visit: Payer: Self-pay

## 2021-06-18 ENCOUNTER — Ambulatory Visit: Payer: No Typology Code available for payment source | Admitting: Radiation Oncology

## 2021-06-18 VITALS — BP 127/69 | HR 93 | Temp 96.8°F | Resp 18 | Ht 63.0 in | Wt 132.0 lb

## 2021-06-18 DIAGNOSIS — D0512 Intraductal carcinoma in situ of left breast: Secondary | ICD-10-CM | POA: Insufficient documentation

## 2021-06-18 DIAGNOSIS — I10 Essential (primary) hypertension: Secondary | ICD-10-CM | POA: Diagnosis not present

## 2021-06-18 DIAGNOSIS — N6310 Unspecified lump in the right breast, unspecified quadrant: Secondary | ICD-10-CM | POA: Insufficient documentation

## 2021-06-18 DIAGNOSIS — Z170 Estrogen receptor positive status [ER+]: Secondary | ICD-10-CM | POA: Diagnosis not present

## 2021-06-18 DIAGNOSIS — Z79899 Other long term (current) drug therapy: Secondary | ICD-10-CM | POA: Diagnosis not present

## 2021-06-18 DIAGNOSIS — Z808 Family history of malignant neoplasm of other organs or systems: Secondary | ICD-10-CM | POA: Insufficient documentation

## 2021-06-18 DIAGNOSIS — E785 Hyperlipidemia, unspecified: Secondary | ICD-10-CM | POA: Diagnosis not present

## 2021-06-18 DIAGNOSIS — M329 Systemic lupus erythematosus, unspecified: Secondary | ICD-10-CM | POA: Insufficient documentation

## 2021-06-18 NOTE — Progress Notes (Signed)
Consult appointment. I verified patient identity and began nursing interview w/ mother "Loretha Brasil" in attendance. Patient is doing well. No issues reported at this time. ? ?Meaningful use complete. ?Postmenopausal-No chances of pregnancy. ? ?BP 127/69 (BP Location: Right Arm, Patient Position: Sitting, Cuff Size: Normal)   Pulse 93   Temp (!) 96.8 ?F (36 ?C) (Temporal)   Resp 18   Ht '5\' 3"'$  (1.6 m)   Wt 132 lb (59.9 kg)   LMP 09/17/2015   SpO2 100%   BMI 23.38 kg/m?  ? ?

## 2021-06-19 ENCOUNTER — Ambulatory Visit
Admission: RE | Admit: 2021-06-19 | Discharge: 2021-06-19 | Disposition: A | Discharge status: Home / Self Care | Payer: No Typology Code available for payment source | Source: Ambulatory Visit | Attending: Radiation Oncology | Admitting: Radiation Oncology

## 2021-06-19 DIAGNOSIS — D0512 Intraductal carcinoma in situ of left breast: Secondary | ICD-10-CM | POA: Diagnosis not present

## 2021-06-25 ENCOUNTER — Encounter: Payer: Self-pay | Admitting: *Deleted

## 2021-06-29 ENCOUNTER — Telehealth: Payer: Self-pay | Admitting: Radiation Oncology

## 2021-06-29 ENCOUNTER — Other Ambulatory Visit: Payer: Self-pay

## 2021-06-29 ENCOUNTER — Ambulatory Visit
Admission: RE | Admit: 2021-06-29 | Discharge: 2021-06-29 | Disposition: A | Discharge status: Home / Self Care | Payer: No Typology Code available for payment source | Source: Ambulatory Visit | Attending: Radiation Oncology | Admitting: Radiation Oncology

## 2021-06-29 DIAGNOSIS — D0512 Intraductal carcinoma in situ of left breast: Secondary | ICD-10-CM | POA: Insufficient documentation

## 2021-06-29 DIAGNOSIS — Z808 Family history of malignant neoplasm of other organs or systems: Secondary | ICD-10-CM | POA: Diagnosis not present

## 2021-06-29 DIAGNOSIS — I10 Essential (primary) hypertension: Secondary | ICD-10-CM | POA: Diagnosis not present

## 2021-06-29 NOTE — Progress Notes (Signed)
Dr. Lisbeth Renshaw called me this morning to let me know that he felt that the patient is better suited for 6-1/2 weeks course of radiotherapy due to the presence and location of her in situ implants.  I spent time this afternoon talking with her at the treatment machine and she was comfortable moving forward with 6-1/2 weeks of radiotherapy rather than the 4 weeks we had originally spoken about.  The goal would be to reduce risks of architectural distortion and scarring from shorter therapy where the dose per fraction would be higher.  Her location of her lumpectomy cavity is also quite close in proximity which influences this as well.  Adjustments will be made to her treatment schedule.  We will otherwise proceed with treatment with the 6 1/2 week regimen today. ?

## 2021-06-29 NOTE — Telephone Encounter (Signed)
I called the patient to check in with her regarding her treatment plan. She does have in situ breast implants, and after contouring her imaging and planning her therapy, Dr. Lisbeth Renshaw recommends extending her treatment regimen to 6 1/2 weeks. She was not available during my call so I left a message asking her to call me when she had a moment. ?

## 2021-06-30 ENCOUNTER — Ambulatory Visit
Admission: RE | Admit: 2021-06-30 | Discharge: 2021-06-30 | Disposition: A | Discharge status: Home / Self Care | Payer: No Typology Code available for payment source | Source: Ambulatory Visit | Attending: Radiation Oncology | Admitting: Radiation Oncology

## 2021-06-30 DIAGNOSIS — D0512 Intraductal carcinoma in situ of left breast: Secondary | ICD-10-CM | POA: Diagnosis not present

## 2021-07-01 ENCOUNTER — Other Ambulatory Visit: Payer: Self-pay

## 2021-07-01 ENCOUNTER — Ambulatory Visit
Admission: RE | Admit: 2021-07-01 | Discharge: 2021-07-01 | Disposition: A | Discharge status: Home / Self Care | Payer: No Typology Code available for payment source | Source: Ambulatory Visit | Attending: Radiation Oncology | Admitting: Radiation Oncology

## 2021-07-01 DIAGNOSIS — D0512 Intraductal carcinoma in situ of left breast: Secondary | ICD-10-CM | POA: Diagnosis not present

## 2021-07-02 ENCOUNTER — Ambulatory Visit
Admission: RE | Admit: 2021-07-02 | Discharge: 2021-07-02 | Disposition: A | Discharge status: Home / Self Care | Payer: No Typology Code available for payment source | Source: Ambulatory Visit | Attending: Radiation Oncology | Admitting: Radiation Oncology

## 2021-07-02 ENCOUNTER — Inpatient Hospital Stay: Payer: No Typology Code available for payment source | Admitting: Hematology and Oncology

## 2021-07-02 DIAGNOSIS — D0512 Intraductal carcinoma in situ of left breast: Secondary | ICD-10-CM | POA: Diagnosis not present

## 2021-07-03 ENCOUNTER — Other Ambulatory Visit: Payer: Self-pay

## 2021-07-03 ENCOUNTER — Ambulatory Visit
Admission: RE | Admit: 2021-07-03 | Discharge: 2021-07-03 | Disposition: A | Discharge status: Home / Self Care | Payer: No Typology Code available for payment source | Source: Ambulatory Visit | Attending: Radiation Oncology | Admitting: Radiation Oncology

## 2021-07-03 DIAGNOSIS — D0512 Intraductal carcinoma in situ of left breast: Secondary | ICD-10-CM | POA: Diagnosis not present

## 2021-07-03 MED ORDER — RADIAPLEXRX EX GEL: Freq: Once | CUTANEOUS | Status: AC

## 2021-07-03 NOTE — Progress Notes (Signed)
Pt here for patient teaching.  Pt given Radiation and You booklet and skin care instructions.  Patient was advised to obtain over the counter aluminum free deodorant.  Reviewed areas of pertinence such as fatigue, hair loss, skin changes, breast tenderness, and breast swelling. Pt able to give teach back of to pat skin and use unscented/gentle soap, avoid applying anything to skin within 4 hours of treatment, avoid wearing an under wire bra, and to use an electric razor if they must shave. Pt verbalizes understanding of information given and will contact nursing with any questions or concerns.  Due to contraindications she was not given radiaplex or sonafine cream.  She was advised to use pure aloe or vitamin E oil on her skin.  She voiced she would like to try the radiaplex and understands if she has a reaction to discontinue use. ?

## 2021-07-06 ENCOUNTER — Ambulatory Visit
Admission: RE | Admit: 2021-07-06 | Discharge: 2021-07-06 | Disposition: A | Discharge status: Home / Self Care | Payer: No Typology Code available for payment source | Source: Ambulatory Visit | Attending: Radiation Oncology | Admitting: Radiation Oncology

## 2021-07-06 ENCOUNTER — Other Ambulatory Visit: Payer: Self-pay

## 2021-07-06 DIAGNOSIS — D0512 Intraductal carcinoma in situ of left breast: Secondary | ICD-10-CM | POA: Diagnosis not present

## 2021-07-07 ENCOUNTER — Ambulatory Visit
Admission: RE | Admit: 2021-07-07 | Discharge: 2021-07-07 | Disposition: A | Discharge status: Home / Self Care | Payer: No Typology Code available for payment source | Source: Ambulatory Visit | Attending: Radiation Oncology | Admitting: Radiation Oncology

## 2021-07-07 DIAGNOSIS — D0512 Intraductal carcinoma in situ of left breast: Secondary | ICD-10-CM | POA: Diagnosis not present

## 2021-07-08 ENCOUNTER — Encounter: Payer: Self-pay | Admitting: Hematology and Oncology

## 2021-07-08 ENCOUNTER — Ambulatory Visit
Admission: RE | Admit: 2021-07-08 | Discharge: 2021-07-08 | Disposition: A | Discharge status: Home / Self Care | Payer: No Typology Code available for payment source | Source: Ambulatory Visit | Attending: Radiation Oncology | Admitting: Radiation Oncology

## 2021-07-08 ENCOUNTER — Inpatient Hospital Stay
Payer: No Typology Code available for payment source | Attending: Hematology and Oncology | Admitting: Hematology and Oncology

## 2021-07-08 ENCOUNTER — Other Ambulatory Visit: Payer: Self-pay

## 2021-07-08 VITALS — BP 124/68 | HR 84 | Temp 97.5°F | Resp 16 | Ht 63.0 in | Wt 133.2 lb

## 2021-07-08 DIAGNOSIS — D0512 Intraductal carcinoma in situ of left breast: Secondary | ICD-10-CM | POA: Diagnosis not present

## 2021-07-08 DIAGNOSIS — Z13820 Encounter for screening for osteoporosis: Secondary | ICD-10-CM | POA: Diagnosis not present

## 2021-07-08 DIAGNOSIS — Z808 Family history of malignant neoplasm of other organs or systems: Secondary | ICD-10-CM | POA: Insufficient documentation

## 2021-07-08 DIAGNOSIS — I10 Essential (primary) hypertension: Secondary | ICD-10-CM | POA: Insufficient documentation

## 2021-07-08 MED ORDER — ANASTROZOLE 1 MG PO TABS: 1.0000 mg | ORAL_TABLET | Freq: Every day | ORAL | 3 refills | Status: AC

## 2021-07-08 NOTE — Progress Notes (Signed)
[facility] ?CONSULT NOTE ? ?Patient Care Team: ?[name], MD as PCP - General (Internal Medicine) ?Dr. Jerene Pitch (Rheumatology) ?Erroll Luna, MD as Consulting Physician (General Surgery) ?Benay Pike, MD as Consulting Physician (Hematology and Oncology) ?Kyung Rudd, MD as Consulting Physician (Radiation Oncology) ?Mauro Kaufmann, RN as Oncology Nurse Navigator ?Rockwell Germany, RN as Oncology Nurse Navigator ? ?CHIEF COMPLAINTS/PURPOSE OF CONSULTATION:  ?Newly diagnosed breast cancer ? ?HISTORY OF PRESENTING ILLNESS:  ?Theresa Fisher 51 y.o. female is here because of recent diagnosis of left breast cancer. ? ?SUMMARY OF ONCOLOGIC HISTORY: ?Oncology History  ?Ductal carcinoma in situ (DCIS) of left breast  ?04/27/2021 Initial Diagnosis  ? Ductal carcinoma in situ (DCIS) of left breast ?  ? Genetic Testing  ? [facility] CancerNext-Expanded Panel was Negative. Report date is 05/11/2021. ? ?The CancerNext-Expanded gene panel offered by [facility] and includes sequencing, rearrangement, and RNA analysis for the following 77 genes: AIP, ALK, APC, ATM, AXIN2, BAP1, BARD1, BLM, BMPR1A, BRCA1, BRCA2, BRIP1, CDC73, CDH1, CDK4, CDKN1B, CDKN2A, CHEK2, CTNNA1, DICER1, FANCC, FH, FLCN, GALNT12, KIF1B, LZTR1, MAX, MEN1, MET, MLH1, MSH2, MSH3, MSH6, MUTYH, NBN, NF1, NF2, NTHL1, PALB2, PHOX2B, PMS2, POT1, PRKAR1A, PTCH1, PTEN, RAD51C, RAD51D, RB1, RECQL, RET, SDHA, SDHAF2, SDHB, SDHC, SDHD, SMAD4, SMARCA4, SMARCB1, SMARCE1, STK11, SUFU, TMEM127, TP53, TSC1, TSC2, VHL and XRCC2 (sequencing and deletion/duplication); EGFR, EGLN1, HOXB13, KIT, MITF, PDGFRA, POLD1, and POLE (sequencing only); EPCAM and GREM1 (deletion/duplication only).  ?  ?05/21/2021 Surgery  ? Lumpectomy on 05/21/2021, DCIS with calcifications measuring 1 cm, DCIS 0.2 cm from superior margin prognostics from prior biopsy showed ER 100% positive, strong staining, PR 20%, positive, moderate staining ?  ? ?Interval history ?She is  into second week of radiation, tolerating it well.  She has healed well from surgery.  No complications. ? ?MEDICAL HISTORY:  ?Past Medical History:  ?Diagnosis Date  ? Breast cancer ([place])   ? Hyperlipidemia   ? Hypertension 03/29/2006  ? Lupus (systemic lupus erythematosus) ([place])   ? ? ?SURGICAL HISTORY: ?Past Surgical History:  ?Procedure Laterality Date  ? BREAST BIOPSY Left 04/16/2021  ? US biopsy  ? BREAST BIOPSY Right 04/27/2021  ? US biopsy  ? BREAST LUMPECTOMY WITH RADIOACTIVE SEED LOCALIZATION Left 05/21/2021  ? Procedure: LEFT BREAST LUMPECTOMY WITH RADIOACTIVE SEED LOCALIZATION;  Surgeon: Erroll Luna, MD;  Location: [facility];  Service: General;  Laterality: Left;  ? cardiac window  01/27/1998  ? mastopexy and augmentation  06/27/2004  ? ? ?SOCIAL HISTORY: ?Social History  ? ?Socioeconomic History  ? Marital status: Divorced  ?  Spouse name: Not on file  ? Number of children: Not on file  ? Years of education: Not on file  ? Highest education level: Not on file  ?Occupational History  ? Not on file  ?Tobacco Use  ? Smoking status: Never  ? Smokeless tobacco: Never  ?Substance and Sexual Activity  ? Alcohol use: Yes  ?  Comment: 1-2 a year  ? Drug use: No  ? Sexual activity: Not on file  ?Other Topics Concern  ? Not on file  ?Social History Narrative  ? Not on file  ? ?Social Determinants of Health  ? ?Financial Resource Strain: Low Risk   ? Difficulty of Paying Living Expenses: Not hard at all  ?Food Insecurity: No Food Insecurity  ? Worried About Charity fundraiser in the Last Year: Never true  ? Ran Out of Food in the Last Year: Never true  ?  Transportation Needs: No Transportation Needs  ? Lack of Transportation (Medical): No  ? Lack of Transportation (Non-Medical): No  ?Physical Activity: Not on file  ?Stress: Not on file  ?Social Connections: Not on file  ?Intimate Partner Violence: Not on file  ? ? ?FAMILY HISTORY: ?Family History  ?Problem Relation Age of Onset  ? Hyperlipidemia  Mother   ? Bone cancer Maternal Uncle   ? Bone cancer Maternal Grandfather   ? Hypertension Other   ? ? ?ALLERGIES:  is allergic to penicillins and solbar pf spf15. ? ?MEDICATIONS:  ?Current Outpatient Medications  ?Medication Sig Dispense Refill  ? anastrozole (ARIMIDEX) 1 MG tablet Take 1 tablet (1 mg total) by mouth daily. 30 tablet 3  ? amLODipine (NORVASC) 5 MG tablet TAKE 1 TABLET (5 MG TOTAL) BY MOUTH DAILY. (Patient taking differently: 10 mg.) 90 tablet 2  ? atorvastatin (LIPITOR) 40 MG tablet Take 1 tablet (40 mg total) by mouth daily. 90 tablet 2  ? hydrochlorothiazide (MICROZIDE) 12.5 MG capsule Take 1 capsule (12.5 mg total) by mouth daily. Take 1 capsule (12.5 total) by mouth daily - OFFICE APPOINTMENT NECESSARY FOR REFILLS (Patient taking differently: Take 25 mg by mouth daily. Take 1 capsule (12.5 total) by mouth daily - OFFICE APPOINTMENT NECESSARY FOR REFILLS) 30 capsule 0  ? hydroxychloroquine (PLAQUENIL) 200 MG tablet Take 1 tablet (200 mg total) by mouth 2 (two) times daily.    ? lisinopril (ZESTRIL) 30 MG tablet Take 30 mg by mouth daily.    ? Magnesium 125 MG CAPS Take by mouth.    ? minoxidil (LONITEN) 2.5 MG tablet Take by mouth daily.    ? Omega-3 Fatty Acids (OMEGA-3 FISH OIL PO) Take by mouth.    ? oxyCODONE (OXY IR/ROXICODONE) 5 MG immediate release tablet Take 1 tablet (5 mg total) by mouth every 6 (six) hours as needed for severe pain. 15 tablet 0  ? Probiotic Product (PROBIOTIC-10) CHEW Chew by mouth.    ? ?No current facility-administered medications for this visit.  ? ? ?REVIEW OF SYSTEMS:   ?Constitutional: Denies fevers, chills or abnormal night sweats ?Eyes: Denies blurriness of vision, double vision or watery eyes ?Ears, nose, mouth, throat, and face: Denies mucositis or sore throat ?Respiratory: Denies cough, dyspnea or wheezes ?Cardiovascular: Denies palpitation, chest discomfort or lower extremity swelling ?Gastrointestinal:  Denies nausea, heartburn or change in bowel  habits ?Skin: Denies abnormal skin rashes ?Lymphatics: Denies new lymphadenopathy or easy bruising ?Neurological:Denies numbness, tingling or new weaknesses ?Behavioral/Psych: Mood is stable, no new changes  ?Breast: Denies any palpable lumps or discharge ?All other systems were reviewed with the patient and are negative. ? ?PHYSICAL EXAMINATION: ?ECOG PERFORMANCE STATUS: 0 - Asymptomatic ? ?Vitals:  ? 07/08/21 0832  ?BP: 124/68  ?Pulse: 84  ?Resp: 16  ?Temp: (!) 97.5 ?F (36.4 ?C)  ?SpO2: 100%  ? ?Filed Weights  ? 07/08/21 0832  ?Weight: 133 lb 3.2 oz (60.4 kg)  ? ? ?Physical exam deferred today in lieu of counseling ? ? ?LABORATORY DATA:  ?I have reviewed the data as listed ?Lab Results  ?Component Value Date  ? WBC 6.2 04/29/2021  ? HGB 13.1 04/29/2021  ? HCT 37.6 04/29/2021  ? MCV 78.7 (L) 04/29/2021  ? PLT 245 04/29/2021  ? ?Lab Results  ?Component Value Date  ? NA 139 04/29/2021  ? K 4.2 04/29/2021  ? CL 103 04/29/2021  ? CO2 30 04/29/2021  ? ? ?RADIOGRAPHIC STUDIES: ?I have personally reviewed the radiological reports and  agreed with the findings in the report. ? ?ASSESSMENT AND PLAN:  ?Ductal carcinoma in situ (DCIS) of left breast ?This is a very pleasant 51 year old female patient with DCIS, ER +100% strong, PR 20% positive moderate staining of the left breast presented at [facility] for recommendations. During her first visit, we discussed about upfront surgery followed by adjuvant radiation and antiestrogen therapy.  She had surgery on May 21, 2021, final pathology showed DCIS measuring 1 cm, margins negative, prior prognostics ER 100% strong, PR 20% strong.  She is now undergoing adjuvant radiation and is tolerating it very well.  She is here to discuss about antiestrogen therapy.  We once again discussed about her options including tamoxifen versus aromatase inhibitors.  We have discussed mechanism of action of each and side effects from tamoxifen which include but not limited to postmenopausal symptoms  including hot flashes, mood changes insomnia, small risk of DVT/PE about 1 to 2%, increased risk of endometrial cancer.  A benefit from tamoxifen would be improvement in bone density besides reducing risk of br

## 2021-07-08 NOTE — Assessment & Plan Note (Addendum)
This is a very pleasant 51 year old female patient with DCIS, ER +100% strong, PR 20% positive moderate staining of the left breast presented at [facility] for recommendations. During her first visit, we discussed about upfront surgery followed by adjuvant radiation and antiestrogen therapy.  She had surgery on May 21, 2021, final pathology showed DCIS measuring 1 cm, margins negative, prior prognostics ER 100% strong, PR 20% strong.  She is now undergoing adjuvant radiation and is tolerating it very well.  She is here to discuss about antiestrogen therapy.  We once again discussed about her options including tamoxifen versus aromatase inhibitors.  We have discussed mechanism of action of each and side effects from tamoxifen which include but not limited to postmenopausal symptoms including hot flashes, mood changes insomnia, small risk of DVT/PE about 1 to 2%, increased risk of endometrial cancer.  A benefit from tamoxifen would be improvement in bone density besides reducing risk of breast cancer.  We have also discussed about aromatase inhibitors, mechanism of action, adverse effects including but not omitted to postmenopausal symptoms, bone loss, arthralgias.  Any antiestrogen therapy can increase risk of cardiovascular events however her benefits overweigh her risks.  She is willing to start anastrozole after completing radiation.  Prescription dispensed to the pharmacy of her choice.  She will return to clinic in August to review toxicity on aromatase inhibitors.  Baseline bone density ordered. ? ?Thank you for consulting [place] in the care of this patient.  Please not hesitate to contact us with any additional questions or concerns ?

## 2021-07-09 ENCOUNTER — Ambulatory Visit
Admission: RE | Admit: 2021-07-09 | Discharge: 2021-07-09 | Disposition: A | Discharge status: Home / Self Care | Payer: No Typology Code available for payment source | Source: Ambulatory Visit | Attending: Radiation Oncology | Admitting: Radiation Oncology

## 2021-07-09 ENCOUNTER — Encounter: Payer: Self-pay | Admitting: Hematology and Oncology

## 2021-07-09 DIAGNOSIS — D0512 Intraductal carcinoma in situ of left breast: Secondary | ICD-10-CM | POA: Diagnosis not present

## 2021-07-10 ENCOUNTER — Ambulatory Visit
Admission: RE | Admit: 2021-07-10 | Discharge: 2021-07-10 | Disposition: A | Discharge status: Home / Self Care | Payer: No Typology Code available for payment source | Source: Ambulatory Visit | Attending: Radiation Oncology | Admitting: Radiation Oncology

## 2021-07-10 ENCOUNTER — Other Ambulatory Visit: Payer: Self-pay

## 2021-07-10 DIAGNOSIS — D0512 Intraductal carcinoma in situ of left breast: Secondary | ICD-10-CM | POA: Diagnosis not present

## 2021-07-13 ENCOUNTER — Other Ambulatory Visit: Payer: Self-pay

## 2021-07-13 ENCOUNTER — Ambulatory Visit
Admission: RE | Admit: 2021-07-13 | Discharge: 2021-07-13 | Disposition: A | Discharge status: Home / Self Care | Payer: No Typology Code available for payment source | Source: Ambulatory Visit | Attending: Radiation Oncology | Admitting: Radiation Oncology

## 2021-07-13 DIAGNOSIS — D0512 Intraductal carcinoma in situ of left breast: Secondary | ICD-10-CM | POA: Diagnosis not present

## 2021-07-14 ENCOUNTER — Other Ambulatory Visit: Payer: Self-pay

## 2021-07-14 ENCOUNTER — Ambulatory Visit
Admission: RE | Admit: 2021-07-14 | Discharge: 2021-07-14 | Disposition: A | Discharge status: Home / Self Care | Payer: No Typology Code available for payment source | Source: Ambulatory Visit | Attending: Radiation Oncology | Admitting: Radiation Oncology

## 2021-07-14 DIAGNOSIS — D0512 Intraductal carcinoma in situ of left breast: Secondary | ICD-10-CM | POA: Diagnosis not present

## 2021-07-14 LAB — RAD ONC ARIA SESSION SUMMARY
Course Elapsed Days: 15
Plan Fractions Treated to Date: 12
Plan Prescribed Dose Per Fraction: 1.8 Gy
Plan Total Fractions Prescribed: 28
Plan Total Prescribed Dose: 50.4 Gy
Reference Point Dosage Given to Date: 21.6 Gy
Reference Point Session Dosage Given: 1.8 Gy
Session Number: 12

## 2021-07-15 ENCOUNTER — Ambulatory Visit
Admission: RE | Admit: 2021-07-15 | Discharge: 2021-07-15 | Disposition: A | Discharge status: Home / Self Care | Payer: No Typology Code available for payment source | Source: Ambulatory Visit | Attending: Radiation Oncology | Admitting: Radiation Oncology

## 2021-07-15 ENCOUNTER — Other Ambulatory Visit: Payer: Self-pay

## 2021-07-15 DIAGNOSIS — D0512 Intraductal carcinoma in situ of left breast: Secondary | ICD-10-CM | POA: Diagnosis not present

## 2021-07-16 ENCOUNTER — Ambulatory Visit
Admission: RE | Admit: 2021-07-16 | Discharge: 2021-07-16 | Disposition: A | Discharge status: Home / Self Care | Payer: No Typology Code available for payment source | Source: Ambulatory Visit | Attending: Radiation Oncology | Admitting: Radiation Oncology

## 2021-07-16 ENCOUNTER — Other Ambulatory Visit: Payer: Self-pay

## 2021-07-16 DIAGNOSIS — D0512 Intraductal carcinoma in situ of left breast: Secondary | ICD-10-CM | POA: Diagnosis not present

## 2021-07-16 LAB — RAD ONC ARIA SESSION SUMMARY
Course Elapsed Days: 17
Plan Fractions Treated to Date: 14
Plan Prescribed Dose Per Fraction: 1.8 Gy
Plan Total Fractions Prescribed: 28
Plan Total Prescribed Dose: 50.4 Gy
Reference Point Dosage Given to Date: 25.2 Gy
Reference Point Session Dosage Given: 1.8 Gy
Session Number: 13

## 2021-07-17 ENCOUNTER — Other Ambulatory Visit: Payer: Self-pay

## 2021-07-17 ENCOUNTER — Ambulatory Visit
Admission: RE | Admit: 2021-07-17 | Discharge: 2021-07-17 | Disposition: A | Discharge status: Home / Self Care | Payer: No Typology Code available for payment source | Source: Ambulatory Visit | Attending: Radiation Oncology | Admitting: Radiation Oncology

## 2021-07-17 ENCOUNTER — Ambulatory Visit: Payer: No Typology Code available for payment source | Admitting: Radiation Oncology

## 2021-07-17 DIAGNOSIS — D0512 Intraductal carcinoma in situ of left breast: Secondary | ICD-10-CM | POA: Diagnosis not present

## 2021-07-17 LAB — RAD ONC ARIA SESSION SUMMARY
Course Elapsed Days: 18
Plan Fractions Treated to Date: 15
Plan Prescribed Dose Per Fraction: 1.8 Gy
Plan Total Fractions Prescribed: 28
Plan Total Prescribed Dose: 50.4 Gy
Reference Point Dosage Given to Date: 27 Gy
Reference Point Session Dosage Given: 1.8 Gy
Session Number: 14

## 2021-07-20 ENCOUNTER — Other Ambulatory Visit: Payer: Self-pay

## 2021-07-20 ENCOUNTER — Ambulatory Visit
Admission: RE | Admit: 2021-07-20 | Discharge: 2021-07-20 | Disposition: A | Discharge status: Home / Self Care | Payer: No Typology Code available for payment source | Source: Ambulatory Visit | Attending: Radiation Oncology | Admitting: Radiation Oncology

## 2021-07-20 DIAGNOSIS — D0512 Intraductal carcinoma in situ of left breast: Secondary | ICD-10-CM | POA: Diagnosis not present

## 2021-07-20 LAB — RAD ONC ARIA SESSION SUMMARY
Course Elapsed Days: 21
Plan Fractions Treated to Date: 16
Plan Prescribed Dose Per Fraction: 1.8 Gy
Plan Total Fractions Prescribed: 28
Plan Total Prescribed Dose: 50.4 Gy
Reference Point Dosage Given to Date: 28.8 Gy
Reference Point Session Dosage Given: 1.8 Gy
Session Number: 15

## 2021-07-21 ENCOUNTER — Ambulatory Visit
Admission: RE | Admit: 2021-07-21 | Discharge: 2021-07-21 | Disposition: A | Discharge status: Home / Self Care | Payer: No Typology Code available for payment source | Source: Ambulatory Visit | Attending: Radiation Oncology | Admitting: Radiation Oncology

## 2021-07-21 ENCOUNTER — Other Ambulatory Visit: Payer: Self-pay

## 2021-07-21 DIAGNOSIS — D0512 Intraductal carcinoma in situ of left breast: Secondary | ICD-10-CM | POA: Diagnosis not present

## 2021-07-21 LAB — RAD ONC ARIA SESSION SUMMARY
Course Elapsed Days: 22
Plan Fractions Treated to Date: 17
Plan Prescribed Dose Per Fraction: 1.8 Gy
Plan Total Fractions Prescribed: 28
Plan Total Prescribed Dose: 50.4 Gy
Reference Point Dosage Given to Date: 30.6 Gy
Reference Point Session Dosage Given: 1.8 Gy
Session Number: 16

## 2021-07-22 ENCOUNTER — Other Ambulatory Visit: Payer: Self-pay

## 2021-07-22 ENCOUNTER — Ambulatory Visit
Admission: RE | Admit: 2021-07-22 | Discharge: 2021-07-22 | Disposition: A | Discharge status: Home / Self Care | Payer: No Typology Code available for payment source | Source: Ambulatory Visit | Attending: Radiation Oncology | Admitting: Radiation Oncology

## 2021-07-22 DIAGNOSIS — D0512 Intraductal carcinoma in situ of left breast: Secondary | ICD-10-CM | POA: Diagnosis not present

## 2021-07-22 LAB — RAD ONC ARIA SESSION SUMMARY
Course Elapsed Days: 23
Plan Fractions Treated to Date: 18
Plan Prescribed Dose Per Fraction: 1.8 Gy
Plan Total Fractions Prescribed: 28
Plan Total Prescribed Dose: 50.4 Gy
Reference Point Dosage Given to Date: 32.4 Gy
Reference Point Session Dosage Given: 1.8 Gy
Session Number: 17

## 2021-07-23 ENCOUNTER — Other Ambulatory Visit: Payer: Self-pay

## 2021-07-23 ENCOUNTER — Ambulatory Visit
Admission: RE | Admit: 2021-07-23 | Discharge: 2021-07-23 | Disposition: A | Discharge status: Home / Self Care | Payer: No Typology Code available for payment source | Source: Ambulatory Visit | Attending: Radiation Oncology | Admitting: Radiation Oncology

## 2021-07-23 DIAGNOSIS — D0512 Intraductal carcinoma in situ of left breast: Secondary | ICD-10-CM | POA: Diagnosis not present

## 2021-07-23 LAB — RAD ONC ARIA SESSION SUMMARY
Course Elapsed Days: 24
Plan Fractions Treated to Date: 19
Plan Prescribed Dose Per Fraction: 1.8 Gy
Plan Total Fractions Prescribed: 28
Plan Total Prescribed Dose: 50.4 Gy
Reference Point Dosage Given to Date: 34.2 Gy
Reference Point Session Dosage Given: 1.8 Gy
Session Number: 18

## 2021-07-24 ENCOUNTER — Other Ambulatory Visit: Payer: Self-pay

## 2021-07-24 ENCOUNTER — Ambulatory Visit
Admission: RE | Admit: 2021-07-24 | Discharge: 2021-07-24 | Disposition: A | Discharge status: Home / Self Care | Payer: No Typology Code available for payment source | Source: Ambulatory Visit | Attending: Radiation Oncology | Admitting: Radiation Oncology

## 2021-07-24 DIAGNOSIS — D0512 Intraductal carcinoma in situ of left breast: Secondary | ICD-10-CM | POA: Diagnosis not present

## 2021-07-24 LAB — RAD ONC ARIA SESSION SUMMARY
Course Elapsed Days: 25
Plan Fractions Treated to Date: 20
Plan Prescribed Dose Per Fraction: 1.8 Gy
Plan Total Fractions Prescribed: 28
Plan Total Prescribed Dose: 50.4 Gy
Reference Point Dosage Given to Date: 36 Gy
Reference Point Session Dosage Given: 1.8 Gy
Session Number: 19

## 2021-07-27 ENCOUNTER — Other Ambulatory Visit: Payer: Self-pay

## 2021-07-27 ENCOUNTER — Ambulatory Visit
Admission: RE | Admit: 2021-07-27 | Discharge: 2021-07-27 | Disposition: A | Discharge status: Home / Self Care | Payer: No Typology Code available for payment source | Source: Ambulatory Visit | Attending: Radiation Oncology | Admitting: Radiation Oncology

## 2021-07-27 DIAGNOSIS — D0512 Intraductal carcinoma in situ of left breast: Secondary | ICD-10-CM | POA: Insufficient documentation

## 2021-07-27 LAB — RAD ONC ARIA SESSION SUMMARY
Course Elapsed Days: 28
Plan Fractions Treated to Date: 21
Plan Prescribed Dose Per Fraction: 1.8 Gy
Plan Total Fractions Prescribed: 28
Plan Total Prescribed Dose: 50.4 Gy
Reference Point Dosage Given to Date: 37.8 Gy
Reference Point Session Dosage Given: 1.8 Gy
Session Number: 20

## 2021-07-28 ENCOUNTER — Ambulatory Visit
Admission: RE | Admit: 2021-07-28 | Discharge: 2021-07-28 | Disposition: A | Discharge status: Home / Self Care | Payer: No Typology Code available for payment source | Source: Ambulatory Visit | Attending: Radiation Oncology | Admitting: Radiation Oncology

## 2021-07-28 ENCOUNTER — Encounter: Payer: Self-pay | Admitting: *Deleted

## 2021-07-28 ENCOUNTER — Other Ambulatory Visit: Payer: Self-pay

## 2021-07-28 DIAGNOSIS — D0512 Intraductal carcinoma in situ of left breast: Secondary | ICD-10-CM | POA: Diagnosis not present

## 2021-07-28 LAB — RAD ONC ARIA SESSION SUMMARY
Course Elapsed Days: 29
Plan Fractions Treated to Date: 22
Plan Prescribed Dose Per Fraction: 1.8 Gy
Plan Total Fractions Prescribed: 28
Plan Total Prescribed Dose: 50.4 Gy
Reference Point Dosage Given to Date: 39.6 Gy
Reference Point Session Dosage Given: 1.8 Gy
Session Number: 21

## 2021-07-29 ENCOUNTER — Telehealth: Payer: Self-pay | Admitting: Hematology and Oncology

## 2021-07-29 ENCOUNTER — Ambulatory Visit
Admission: RE | Admit: 2021-07-29 | Discharge: 2021-07-29 | Disposition: A | Discharge status: Home / Self Care | Payer: No Typology Code available for payment source | Source: Ambulatory Visit | Attending: Radiation Oncology | Admitting: Radiation Oncology

## 2021-07-29 ENCOUNTER — Other Ambulatory Visit: Payer: Self-pay

## 2021-07-29 DIAGNOSIS — D0512 Intraductal carcinoma in situ of left breast: Secondary | ICD-10-CM | POA: Diagnosis not present

## 2021-07-29 LAB — RAD ONC ARIA SESSION SUMMARY
Course Elapsed Days: 30
Plan Fractions Treated to Date: 23
Plan Prescribed Dose Per Fraction: 1.8 Gy
Plan Total Fractions Prescribed: 28
Plan Total Prescribed Dose: 50.4 Gy
Reference Point Dosage Given to Date: 41.4 Gy
Reference Point Session Dosage Given: 1.8 Gy
Session Number: 22

## 2021-07-29 NOTE — Telephone Encounter (Signed)
.  Called pt per 5/2 inbasket , Patient was unavailable, a message with appt time and date was left with number on file.   ?

## 2021-07-30 ENCOUNTER — Other Ambulatory Visit: Payer: Self-pay

## 2021-07-30 ENCOUNTER — Ambulatory Visit
Admission: RE | Admit: 2021-07-30 | Discharge: 2021-07-30 | Disposition: A | Discharge status: Home / Self Care | Payer: No Typology Code available for payment source | Source: Ambulatory Visit | Attending: Radiation Oncology | Admitting: Radiation Oncology

## 2021-07-30 DIAGNOSIS — D0512 Intraductal carcinoma in situ of left breast: Secondary | ICD-10-CM | POA: Diagnosis not present

## 2021-07-30 LAB — RAD ONC ARIA SESSION SUMMARY
Course Elapsed Days: 31
Plan Fractions Treated to Date: 24
Plan Prescribed Dose Per Fraction: 1.8 Gy
Plan Total Fractions Prescribed: 28
Plan Total Prescribed Dose: 50.4 Gy
Reference Point Dosage Given to Date: 43.2 Gy
Reference Point Session Dosage Given: 1.8 Gy
Session Number: 23

## 2021-07-31 ENCOUNTER — Ambulatory Visit
Admission: RE | Admit: 2021-07-31 | Discharge: 2021-07-31 | Disposition: A | Discharge status: Home / Self Care | Payer: No Typology Code available for payment source | Source: Ambulatory Visit | Attending: Radiation Oncology | Admitting: Radiation Oncology

## 2021-07-31 ENCOUNTER — Other Ambulatory Visit: Payer: Self-pay

## 2021-07-31 DIAGNOSIS — D0512 Intraductal carcinoma in situ of left breast: Secondary | ICD-10-CM | POA: Diagnosis not present

## 2021-07-31 LAB — RAD ONC ARIA SESSION SUMMARY
Course Elapsed Days: 32
Plan Fractions Treated to Date: 25
Plan Prescribed Dose Per Fraction: 1.8 Gy
Plan Total Fractions Prescribed: 28
Plan Total Prescribed Dose: 50.4 Gy
Reference Point Dosage Given to Date: 45 Gy
Reference Point Session Dosage Given: 1.8 Gy
Session Number: 24

## 2021-08-01 ENCOUNTER — Encounter: Payer: Self-pay | Admitting: Hematology and Oncology

## 2021-08-03 ENCOUNTER — Other Ambulatory Visit: Payer: Self-pay

## 2021-08-03 ENCOUNTER — Ambulatory Visit
Admission: RE | Admit: 2021-08-03 | Discharge: 2021-08-03 | Disposition: A | Discharge status: Home / Self Care | Payer: No Typology Code available for payment source | Source: Ambulatory Visit | Attending: Radiation Oncology | Admitting: Radiation Oncology

## 2021-08-03 DIAGNOSIS — D0512 Intraductal carcinoma in situ of left breast: Secondary | ICD-10-CM | POA: Diagnosis not present

## 2021-08-03 LAB — RAD ONC ARIA SESSION SUMMARY
Course Elapsed Days: 35
Plan Fractions Treated to Date: 26
Plan Prescribed Dose Per Fraction: 1.8 Gy
Plan Total Fractions Prescribed: 28
Plan Total Prescribed Dose: 50.4 Gy
Reference Point Dosage Given to Date: 46.8 Gy
Reference Point Session Dosage Given: 1.8 Gy
Session Number: 25

## 2021-08-04 ENCOUNTER — Other Ambulatory Visit: Payer: Self-pay

## 2021-08-04 ENCOUNTER — Ambulatory Visit
Admission: RE | Admit: 2021-08-04 | Discharge: 2021-08-04 | Disposition: A | Discharge status: Home / Self Care | Payer: No Typology Code available for payment source | Source: Ambulatory Visit | Attending: Radiation Oncology | Admitting: Radiation Oncology

## 2021-08-04 DIAGNOSIS — D0512 Intraductal carcinoma in situ of left breast: Secondary | ICD-10-CM | POA: Diagnosis not present

## 2021-08-04 LAB — RAD ONC ARIA SESSION SUMMARY
Course Elapsed Days: 36
Plan Fractions Treated to Date: 27
Plan Prescribed Dose Per Fraction: 1.8 Gy
Plan Total Fractions Prescribed: 28
Plan Total Prescribed Dose: 50.4 Gy
Reference Point Dosage Given to Date: 48.6 Gy
Reference Point Session Dosage Given: 1.8 Gy
Session Number: 26

## 2021-08-05 ENCOUNTER — Ambulatory Visit
Admission: RE | Admit: 2021-08-05 | Discharge: 2021-08-05 | Disposition: A | Discharge status: Home / Self Care | Payer: No Typology Code available for payment source | Source: Ambulatory Visit | Attending: Radiation Oncology | Admitting: Radiation Oncology

## 2021-08-05 ENCOUNTER — Other Ambulatory Visit: Payer: Self-pay

## 2021-08-05 DIAGNOSIS — D0512 Intraductal carcinoma in situ of left breast: Secondary | ICD-10-CM | POA: Diagnosis not present

## 2021-08-05 LAB — RAD ONC ARIA SESSION SUMMARY
Course Elapsed Days: 37
Plan Fractions Treated to Date: 28
Plan Prescribed Dose Per Fraction: 1.8 Gy
Plan Total Fractions Prescribed: 28
Plan Total Prescribed Dose: 50.4 Gy
Reference Point Dosage Given to Date: 50.4 Gy
Reference Point Session Dosage Given: 1.8 Gy
Session Number: 27

## 2021-08-06 ENCOUNTER — Ambulatory Visit
Admission: RE | Admit: 2021-08-06 | Discharge: 2021-08-06 | Disposition: A | Discharge status: Home / Self Care | Payer: No Typology Code available for payment source | Source: Ambulatory Visit | Attending: Radiation Oncology | Admitting: Radiation Oncology

## 2021-08-06 ENCOUNTER — Other Ambulatory Visit: Payer: Self-pay

## 2021-08-06 DIAGNOSIS — D0512 Intraductal carcinoma in situ of left breast: Secondary | ICD-10-CM | POA: Diagnosis not present

## 2021-08-06 LAB — RAD ONC ARIA SESSION SUMMARY
Course Elapsed Days: 38
Plan Fractions Treated to Date: 1
Plan Prescribed Dose Per Fraction: 2 Gy
Plan Total Fractions Prescribed: 5
Plan Total Prescribed Dose: 10 Gy
Reference Point Dosage Given to Date: 52.4 Gy
Reference Point Session Dosage Given: 2 Gy
Session Number: 28

## 2021-08-07 ENCOUNTER — Other Ambulatory Visit: Payer: Self-pay

## 2021-08-07 ENCOUNTER — Ambulatory Visit
Admission: RE | Admit: 2021-08-07 | Discharge: 2021-08-07 | Disposition: A | Discharge status: Home / Self Care | Payer: No Typology Code available for payment source | Source: Ambulatory Visit | Attending: Radiation Oncology | Admitting: Radiation Oncology

## 2021-08-07 DIAGNOSIS — D0512 Intraductal carcinoma in situ of left breast: Secondary | ICD-10-CM | POA: Diagnosis not present

## 2021-08-07 LAB — RAD ONC ARIA SESSION SUMMARY
Course Elapsed Days: 39
Plan Fractions Treated to Date: 2
Plan Prescribed Dose Per Fraction: 2 Gy
Plan Total Fractions Prescribed: 5
Plan Total Prescribed Dose: 10 Gy
Reference Point Dosage Given to Date: 54.4 Gy
Reference Point Session Dosage Given: 2 Gy
Session Number: 29

## 2021-08-10 ENCOUNTER — Other Ambulatory Visit: Payer: Self-pay

## 2021-08-10 ENCOUNTER — Ambulatory Visit
Admission: RE | Admit: 2021-08-10 | Discharge: 2021-08-10 | Disposition: A | Discharge status: Home / Self Care | Payer: No Typology Code available for payment source | Source: Ambulatory Visit | Attending: Radiation Oncology | Admitting: Radiation Oncology

## 2021-08-10 DIAGNOSIS — D0512 Intraductal carcinoma in situ of left breast: Secondary | ICD-10-CM | POA: Diagnosis not present

## 2021-08-10 LAB — RAD ONC ARIA SESSION SUMMARY
Course Elapsed Days: 42
Plan Fractions Treated to Date: 3
Plan Prescribed Dose Per Fraction: 2 Gy
Plan Total Fractions Prescribed: 5
Plan Total Prescribed Dose: 10 Gy
Reference Point Dosage Given to Date: 56.4 Gy
Reference Point Session Dosage Given: 2 Gy
Session Number: 30

## 2021-08-11 ENCOUNTER — Other Ambulatory Visit: Payer: Self-pay

## 2021-08-11 ENCOUNTER — Ambulatory Visit
Admission: RE | Admit: 2021-08-11 | Discharge: 2021-08-11 | Disposition: A | Discharge status: Home / Self Care | Payer: No Typology Code available for payment source | Source: Ambulatory Visit | Attending: Radiation Oncology | Admitting: Radiation Oncology

## 2021-08-11 ENCOUNTER — Encounter: Payer: Self-pay | Admitting: *Deleted

## 2021-08-11 DIAGNOSIS — D0512 Intraductal carcinoma in situ of left breast: Secondary | ICD-10-CM

## 2021-08-11 LAB — RAD ONC ARIA SESSION SUMMARY
Course Elapsed Days: 43
Plan Fractions Treated to Date: 4
Plan Prescribed Dose Per Fraction: 2 Gy
Plan Total Fractions Prescribed: 5
Plan Total Prescribed Dose: 10 Gy
Reference Point Dosage Given to Date: 58.4 Gy
Reference Point Session Dosage Given: 2 Gy
Session Number: 31

## 2021-08-12 ENCOUNTER — Encounter: Payer: Self-pay | Admitting: Radiation Oncology

## 2021-08-12 ENCOUNTER — Other Ambulatory Visit: Payer: Self-pay

## 2021-08-12 ENCOUNTER — Ambulatory Visit
Admission: RE | Admit: 2021-08-12 | Discharge: 2021-08-12 | Disposition: A | Discharge status: Home / Self Care | Payer: No Typology Code available for payment source | Source: Ambulatory Visit | Attending: Radiation Oncology | Admitting: Radiation Oncology

## 2021-08-12 DIAGNOSIS — D0512 Intraductal carcinoma in situ of left breast: Secondary | ICD-10-CM | POA: Diagnosis not present

## 2021-08-12 LAB — RAD ONC ARIA SESSION SUMMARY
Course Elapsed Days: 44
Plan Fractions Treated to Date: 5
Plan Prescribed Dose Per Fraction: 2 Gy
Plan Total Fractions Prescribed: 5
Plan Total Prescribed Dose: 10 Gy
Reference Point Dosage Given to Date: 60.4 Gy
Reference Point Session Dosage Given: 2 Gy
Session Number: 32

## 2021-08-19 ENCOUNTER — Inpatient Hospital Stay: Payer: No Typology Code available for payment source | Admitting: Hematology and Oncology

## 2021-08-19 NOTE — Progress Notes (Signed)
                                                                                                                                                             Patient Name: Theresa Fisher MRN: [id number] DOB: 01/19/1971 Referring Physician: [name] Date of Service: 08/12/2021 [facility], [place]                                                        End Of Treatment Note  Diagnoses: D05.12-Intraductal carcinoma in situ of left breast  Cancer Staging:  Intermediate  grade, ER/PR positive DCIS of the left breast.  Intent: Curative  Radiation Treatment Dates: 06/29/2021 through 08/12/2021 Site Technique Total Dose (Gy) Dose per Fx (Gy) Completed Fx Beam Energies  Breast, Left: Breast_L 3D 50.4/50.4 1.8 28/28 6X  Breast, Left: Breast_L_Bst specialPort 10/10 2 5/5 6E, 9E   Narrative: The patient tolerated radiation therapy relatively well. She developed fatigue and anticipated skin changes in the treatment field.   Plan: The patient will receive a call in about one month from the radiation oncology department. She will continue follow up with Dr. Chryl Heck as well.  ________________________________________________    Carola Rhine, [facility]

## 2021-09-10 ENCOUNTER — Telehealth: Payer: Self-pay

## 2021-09-10 NOTE — Telephone Encounter (Signed)
Spoke with pt to explain survivorship program. Patient agreed to schedule appointment with Wilber Bihari NP. Scheduling message sent.

## 2021-10-27 ENCOUNTER — Encounter: Payer: Self-pay | Admitting: *Deleted

## 2021-10-30 ENCOUNTER — Ambulatory Visit: Payer: No Typology Code available for payment source | Admitting: Hematology and Oncology

## 2021-10-30 ENCOUNTER — Inpatient Hospital Stay: Payer: No Typology Code available for payment source | Attending: Hematology and Oncology | Admitting: Adult Health

## 2021-11-17 ENCOUNTER — Encounter: Payer: Self-pay | Admitting: *Deleted

## 2021-12-07 ENCOUNTER — Encounter: Payer: Self-pay | Admitting: *Deleted

## 2022-01-21 ENCOUNTER — Other Ambulatory Visit: Payer: No Typology Code available for payment source

## 2022-04-01 ENCOUNTER — Other Ambulatory Visit: Payer: Self-pay | Admitting: Internal Medicine

## 2022-04-01 DIAGNOSIS — Z853 Personal history of malignant neoplasm of breast: Secondary | ICD-10-CM

## 2022-05-26 ENCOUNTER — Other Ambulatory Visit: Payer: Self-pay | Admitting: Hematology and Oncology

## 2022-05-26 DIAGNOSIS — Z853 Personal history of malignant neoplasm of breast: Secondary | ICD-10-CM

## 2022-05-27 ENCOUNTER — Ambulatory Visit
Admission: RE | Admit: 2022-05-27 | Discharge: 2022-05-27 | Disposition: A | Discharge status: Home / Self Care | Payer: No Typology Code available for payment source | Source: Ambulatory Visit | Attending: Internal Medicine | Admitting: Internal Medicine

## 2022-05-27 DIAGNOSIS — Z853 Personal history of malignant neoplasm of breast: Secondary | ICD-10-CM

## 2023-06-28 ENCOUNTER — Other Ambulatory Visit: Payer: Self-pay | Admitting: Internal Medicine

## 2023-06-28 ENCOUNTER — Other Ambulatory Visit: Payer: Self-pay | Admitting: Hematology and Oncology

## 2023-06-28 DIAGNOSIS — Z9889 Other specified postprocedural states: Secondary | ICD-10-CM

## 2023-07-12 ENCOUNTER — Ambulatory Visit
Admission: RE | Admit: 2023-07-12 | Discharge: 2023-07-12 | Disposition: A | Discharge status: Home / Self Care | Source: Ambulatory Visit | Attending: Hematology and Oncology | Admitting: Hematology and Oncology

## 2023-07-12 DIAGNOSIS — Z9889 Other specified postprocedural states: Secondary | ICD-10-CM

## 2023-10-15 IMAGING — US US PLC BREAST LOC DEV 1ST LESION INC US GUIDE*L*
1 series · 2 of 2 positions shown · non-contrast
Comparison: Previous exam(s).

CLINICAL DATA: Pre lumpectomy localization DCIS in the 11 o'clock
position of the left breast, marked with a ribbon shaped biopsy
marker clip.

EXAM:
ULTRASOUND GUIDED RADIOACTIVE SEED LOCALIZATION OF THE LEFT BREAST

[Series 1: us plc breast loc dev 1st lesion inc us guide*left · 0.07mm/px · 2 of 2 slices shown]
[im 1/2]
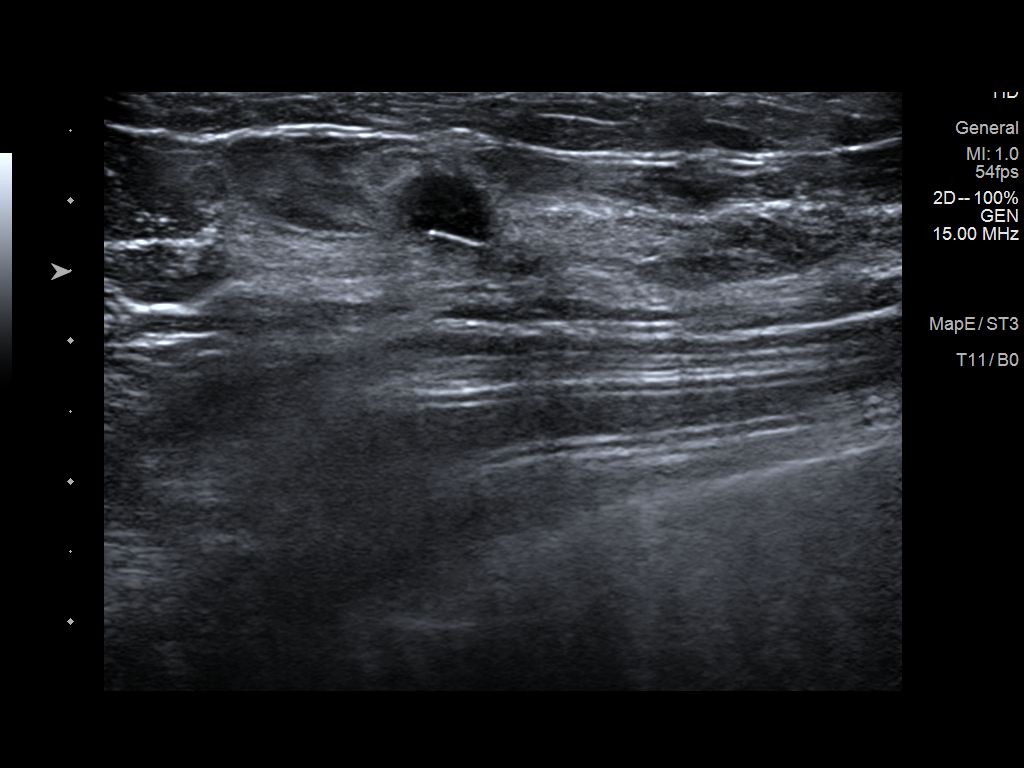
[im 2/2]
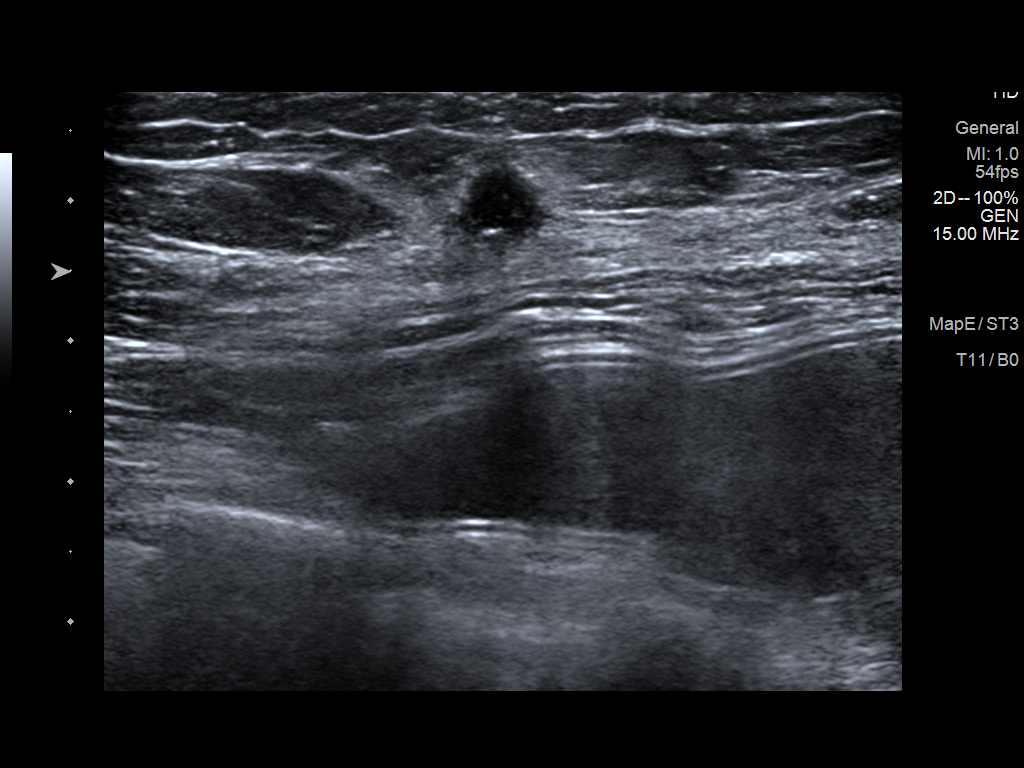

[2 of 2 positions shown; findings below may reference images not displayed]

FINDINGS: Patient presents for radioactive seed localization prior to left
lumpectomy. I met with the patient and we discussed the procedure of
seed localization including benefits and alternatives. We discussed
the high likelihood of a successful procedure. We discussed the
risks of the procedure including infection, bleeding, tissue injury
and further surgery. We discussed the low dose of radioactivity
involved in the procedure. Informed, written consent was given.

The usual time-out protocol was performed immediately prior to the
procedure.

Using ultrasound guidance, sterile technique, 1% lidocaine and an
S-12L radioactive seed, a small post biopsy hematoma in the 11
o'clock position of the left breast, 4 cm from the nipple, was
localized using a medial approach. The follow-up mammogram images
confirm the seed in the expected location and were marked for Dr.
Mirleidys.

Follow-up survey of the patient confirms presence of the radioactive
seed.

Order number of S-12L seed:  [id number].

Total activity:  0.251 mCi reference Date: 05/07/2021

The patient tolerated the procedure well and was released from the
[REDACTED]. She was given instructions regarding seed removal.
IMPRESSION: Radioactive seed localization left breast. No apparent
complications.

## 2023-10-15 IMAGING — MG MM BREAST LOCALIZATION CLIP
4 series · 4 of 12 positions shown · non-contrast
Comparison: Previous exam(s).

CLINICAL DATA: Status post ultrasound-guided radioactive seed
localization an area of DCIS in the 11 o'clock position of the left
breast, marked with a ribbon shaped biopsy marker clip.

EXAM:
DIAGNOSTIC LEFT MAMMOGRAM POST ULTRASOUND-GUIDED RADIOACTIVE SEED
PLACEMENT

[L ML synth-2D]
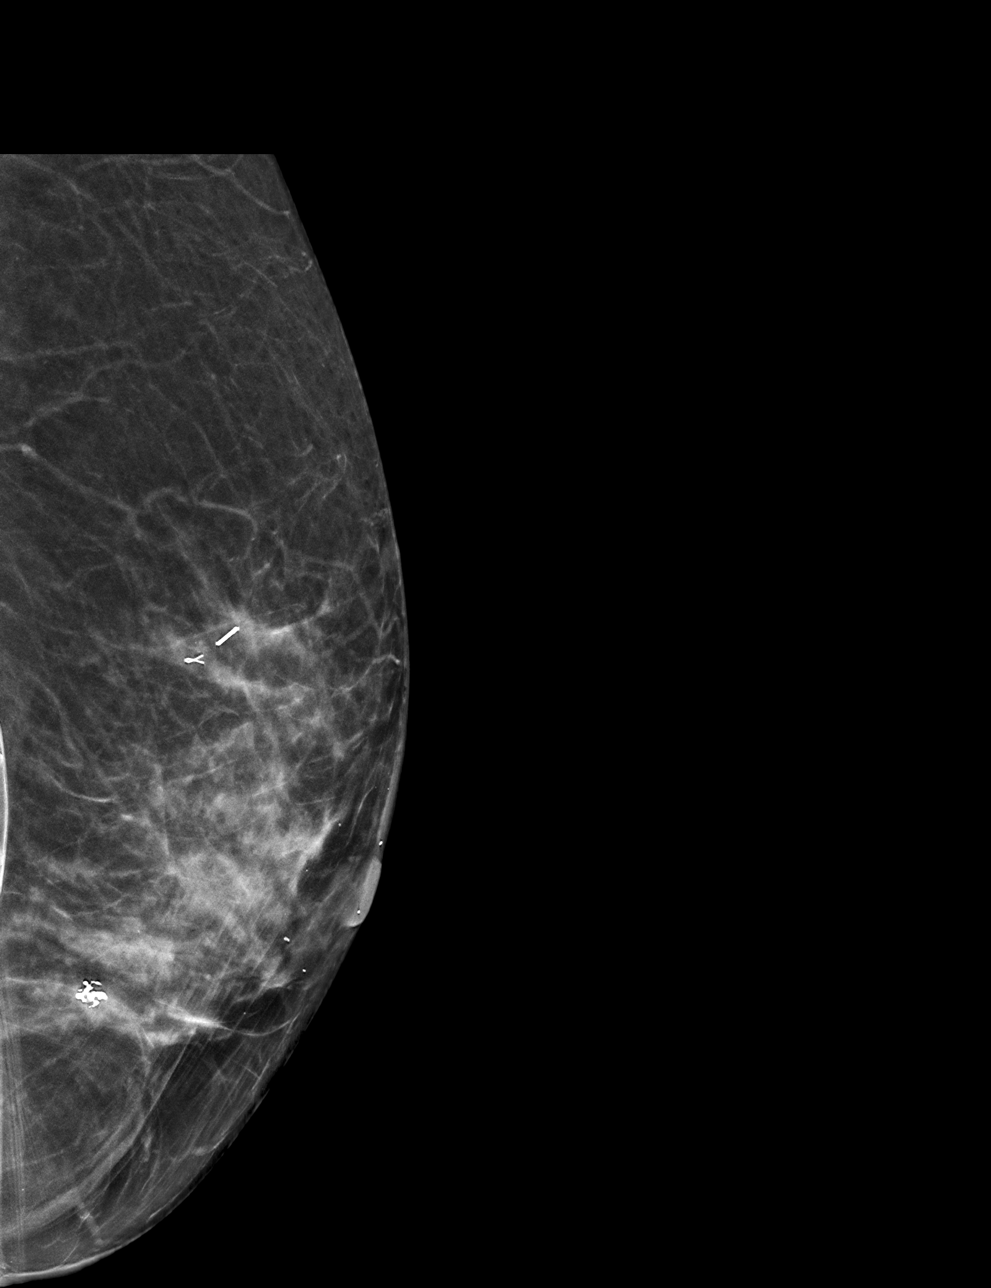

[L CC synth-2D]
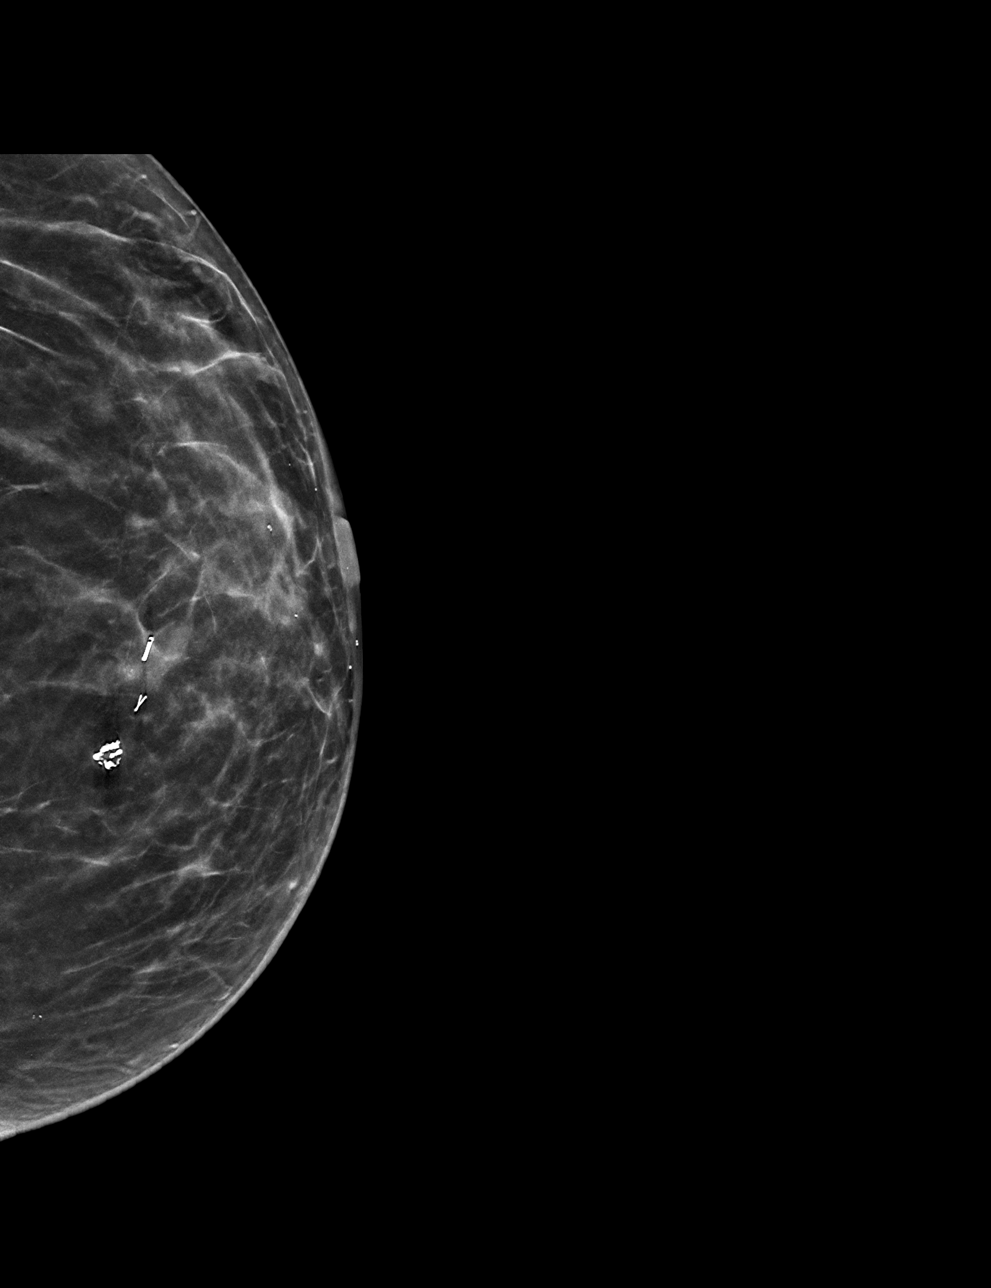

[L CC tomo · tomo slice 24/47.0]
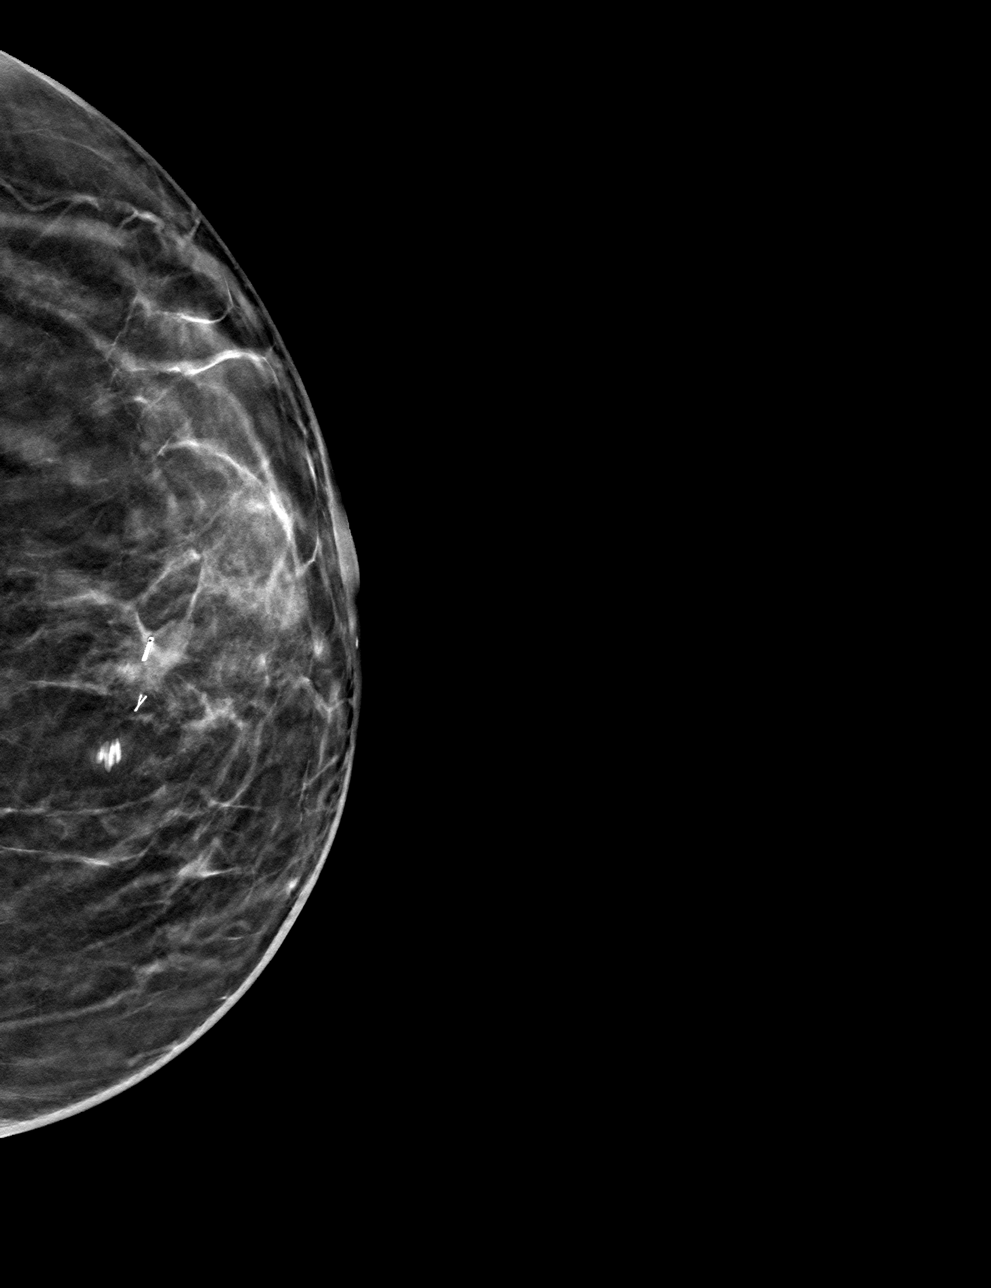

[L ML tomo · tomo slice 27/53.0]
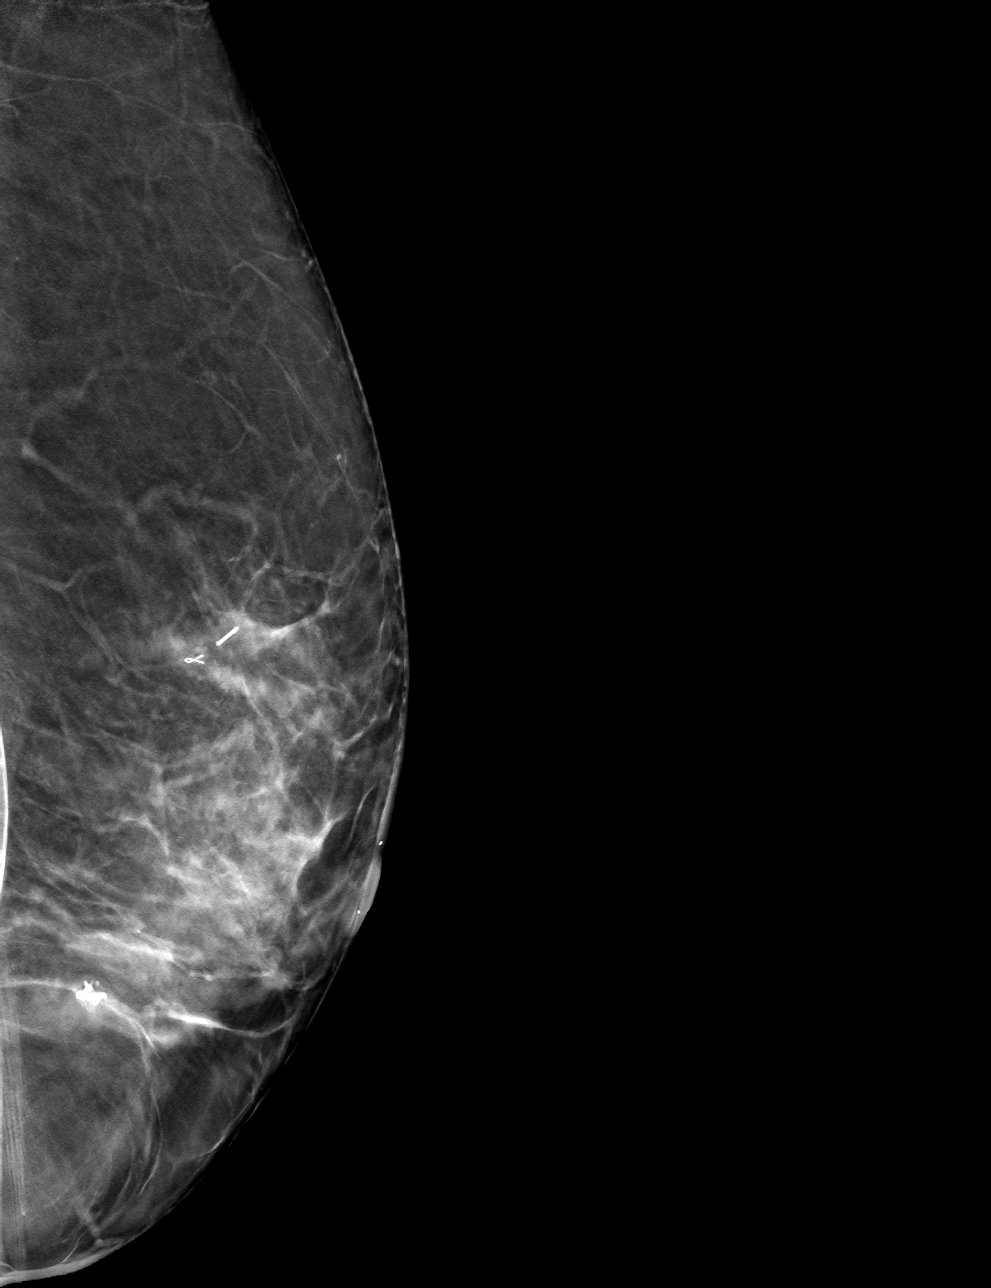

[4 of 12 positions shown; findings below may reference images not displayed]

FINDINGS: Mammographic images were obtained following ultrasound-guided
radioactive seed placement. These demonstrate the radioactive seed
at the location of the previously biopsied mass containing, punctate
calcifications and an adjacent ribbon shaped biopsy marker clip.
IMPRESSION: Appropriate location of the radioactive seed.

Final Assessment: Post Procedure Mammograms for Seed Placement

## 2023-10-16 IMAGING — MG MM BREAST SURGICAL SPECIMEN
1 series · 2 of 2 positions shown · non-contrast
Comparison: Previous exam(s).

CLINICAL DATA: Evaluate specimen

EXAM:
SPECIMEN RADIOGRAPH OF THE LEFT BREAST

[Series 1: L · left · 0.07mm/px · 2 of 2 slices shown]
[im 1/2]
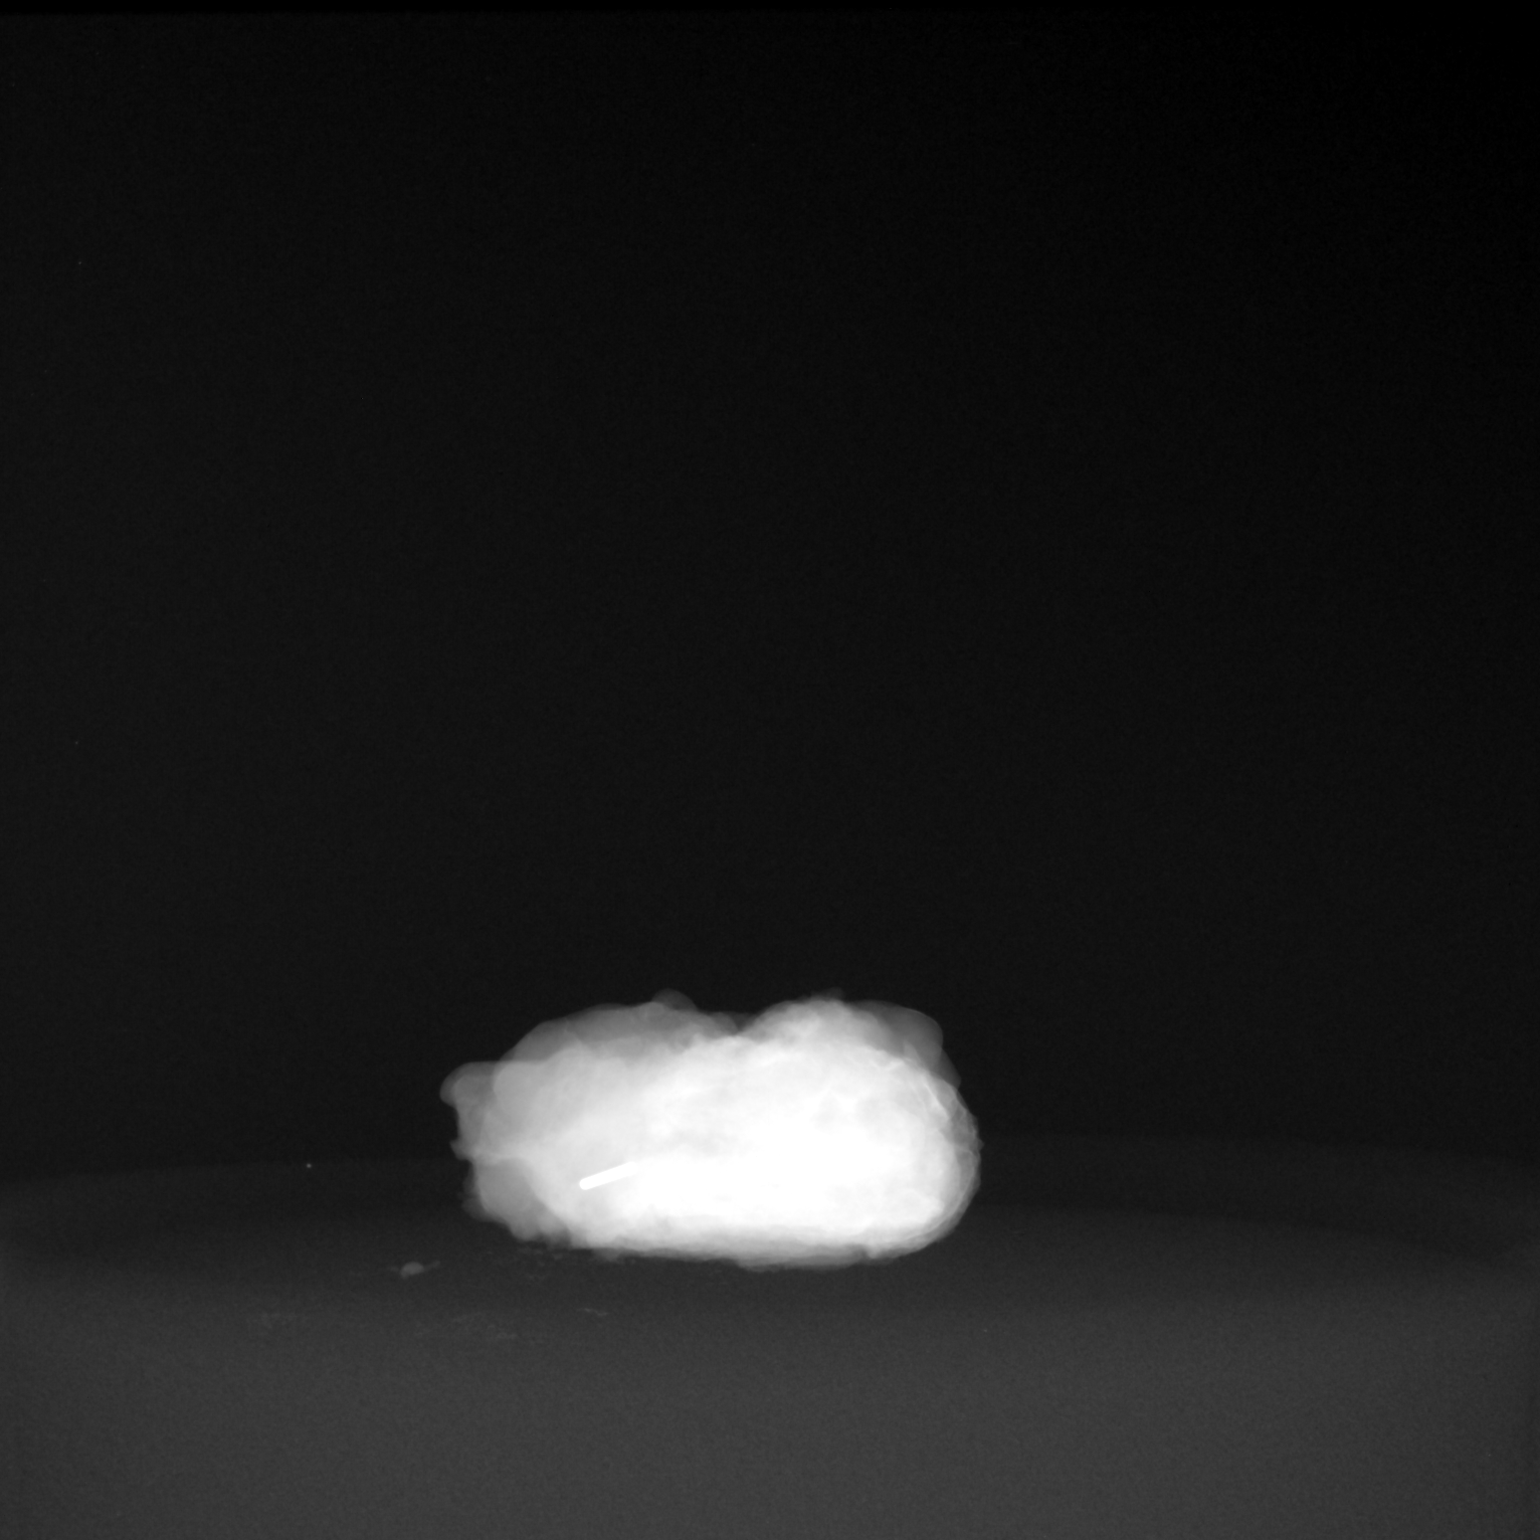
[im 2/2]
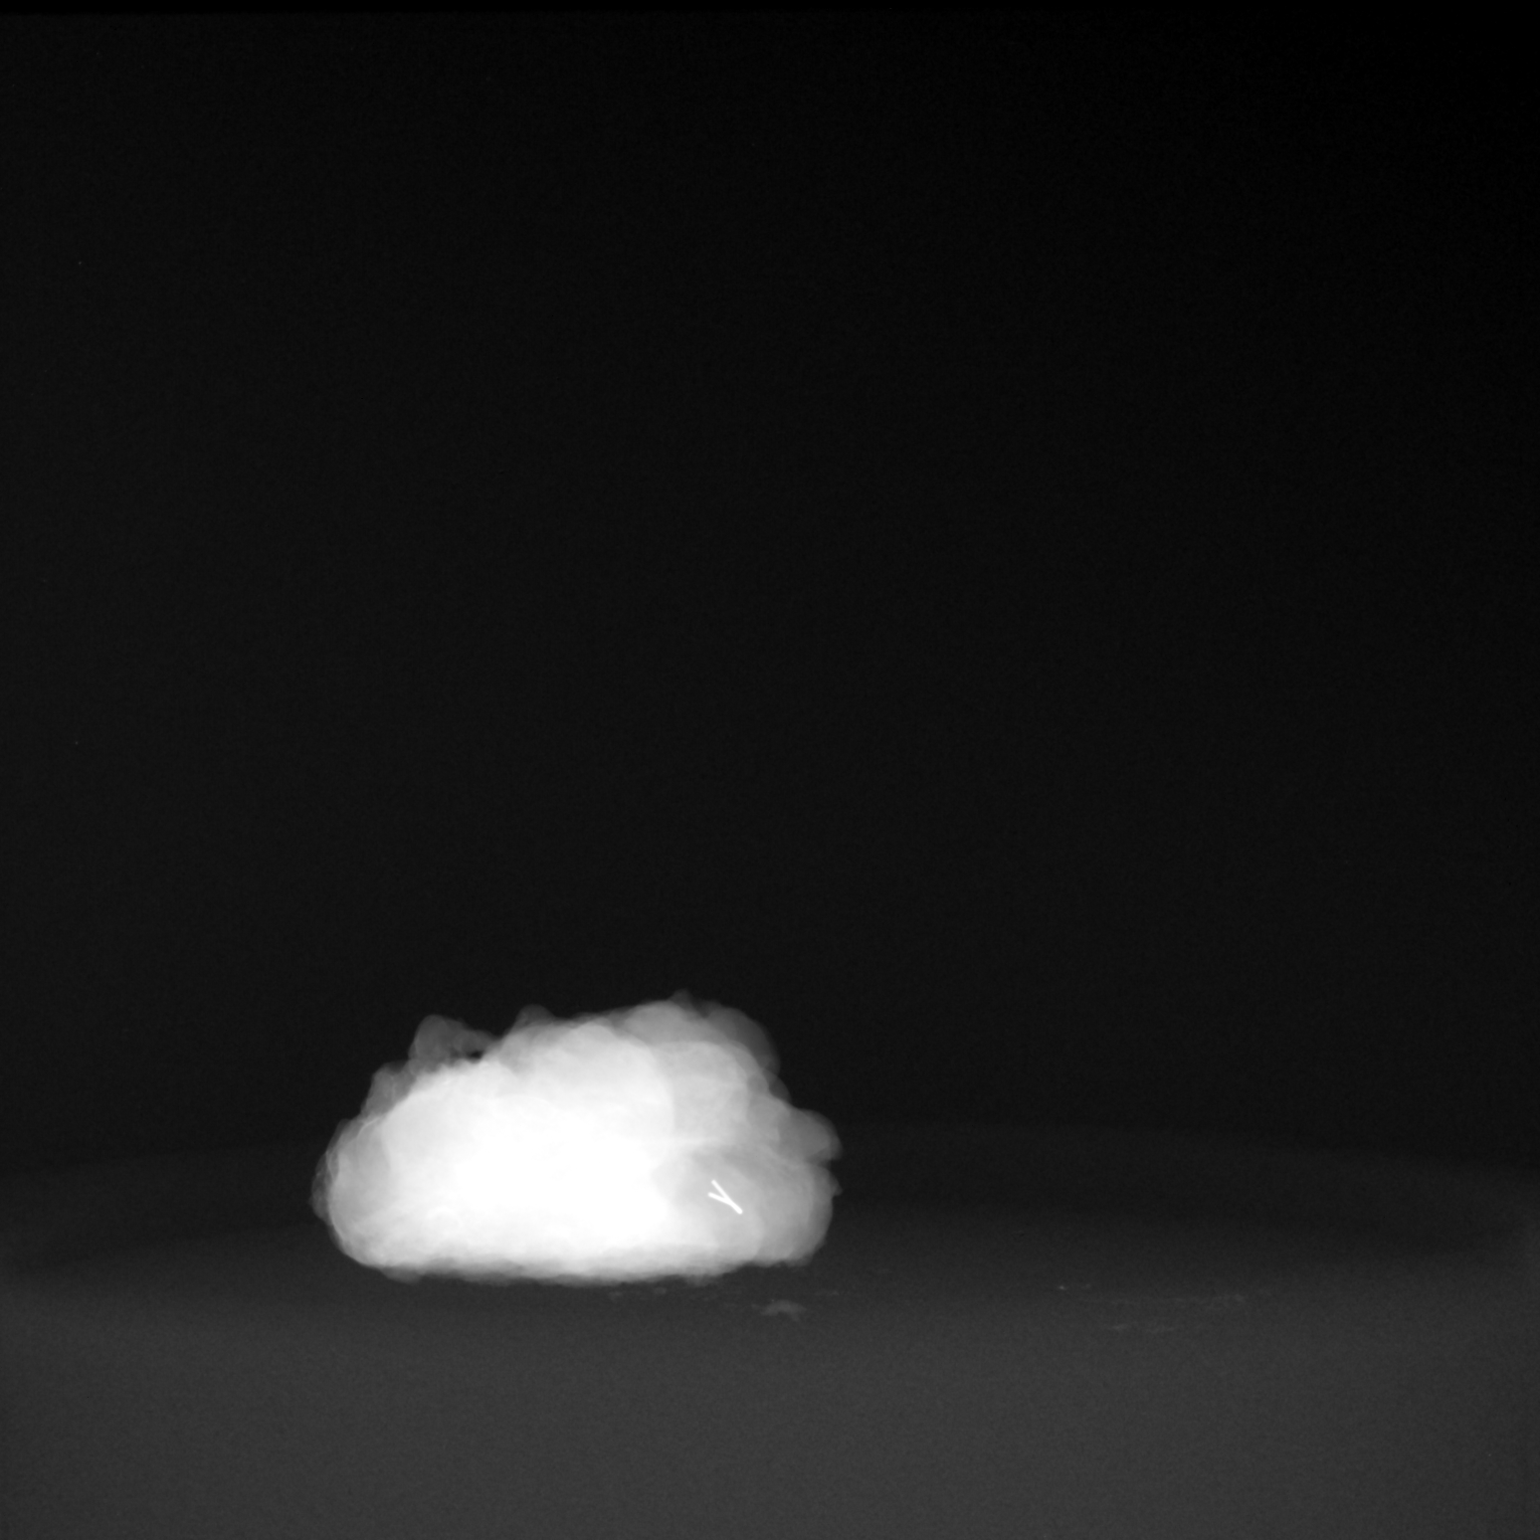

[2 of 2 positions shown; findings below may reference images not displayed]

FINDINGS: Status post excision of the left breast. The radioactive seed and
biopsy marker clip are present, completely intact, and were marked
for pathology.
IMPRESSION: Specimen radiograph of the left breast.
